# Patient Record
Sex: Female | Born: 1975 | Race: White | Hispanic: No | Marital: Married | Smoking: Never smoker
Health system: Southern US, Community
[De-identification: ages and names within clinical notes are randomized; demographics above are authoritative.]

---

## 2006-07-17 ENCOUNTER — Inpatient Hospital Stay: Payer: Self-pay

## 2007-04-26 ENCOUNTER — Emergency Department: Payer: Self-pay | Admitting: Emergency Medicine

## 2007-09-23 ENCOUNTER — Ambulatory Visit: Payer: Self-pay | Admitting: Obstetrics and Gynecology

## 2008-10-12 ENCOUNTER — Ambulatory Visit: Payer: Self-pay

## 2009-10-19 ENCOUNTER — Ambulatory Visit: Payer: Self-pay

## 2010-10-24 ENCOUNTER — Ambulatory Visit: Payer: Self-pay

## 2011-10-29 ENCOUNTER — Ambulatory Visit: Payer: Self-pay

## 2012-12-04 ENCOUNTER — Ambulatory Visit: Payer: Self-pay

## 2013-11-24 ENCOUNTER — Ambulatory Visit: Payer: Self-pay

## 2014-01-19 DIAGNOSIS — J4520 Mild intermittent asthma, uncomplicated: Secondary | ICD-10-CM | POA: Insufficient documentation

## 2014-01-19 DIAGNOSIS — E669 Obesity, unspecified: Secondary | ICD-10-CM | POA: Insufficient documentation

## 2014-11-30 ENCOUNTER — Ambulatory Visit
Admit: 2014-11-30 | Disposition: A | Discharge status: Home / Self Care | Payer: Self-pay | Attending: Obstetrics and Gynecology | Admitting: Obstetrics and Gynecology

## 2015-12-18 ENCOUNTER — Ambulatory Visit
Admission: RE | Admit: 2015-12-18 | Discharge: 2015-12-18 | Disposition: A | Discharge status: Home / Self Care | Payer: BLUE CROSS/BLUE SHIELD | Source: Ambulatory Visit | Attending: Medical | Admitting: Medical

## 2015-12-18 ENCOUNTER — Other Ambulatory Visit: Payer: Self-pay | Admitting: Medical

## 2015-12-18 DIAGNOSIS — S99929D Unspecified injury of unspecified foot, subsequent encounter: Secondary | ICD-10-CM

## 2015-12-18 DIAGNOSIS — S99922D Unspecified injury of left foot, subsequent encounter: Secondary | ICD-10-CM | POA: Insufficient documentation

## 2015-12-26 ENCOUNTER — Ambulatory Visit (INDEPENDENT_AMBULATORY_CARE_PROVIDER_SITE_OTHER): Payer: BLUE CROSS/BLUE SHIELD | Admitting: Podiatry

## 2015-12-26 ENCOUNTER — Ambulatory Visit: Payer: BLUE CROSS/BLUE SHIELD

## 2015-12-26 VITALS — BP 141/85 | HR 64 | Resp 18

## 2015-12-26 DIAGNOSIS — S90129A Contusion of unspecified lesser toe(s) without damage to nail, initial encounter: Secondary | ICD-10-CM | POA: Diagnosis not present

## 2015-12-26 DIAGNOSIS — R52 Pain, unspecified: Secondary | ICD-10-CM | POA: Diagnosis not present

## 2015-12-26 NOTE — Progress Notes (Signed)
   Subjective:    Patient ID: Angela Miranda, female    DOB: 02/25/1976, 40 y.o.   MRN: [id number]  HPI stumped my left big toe on a door frame and my toes separated and does not hurt a lot and x-rays were done on may 1st     Review of Systems  All other systems reviewed and are negative.      Objective:   Physical Exam        Assessment & Plan:

## 2015-12-26 NOTE — Progress Notes (Signed)
Patient ID: Angela Miranda, female   DOB: Mar 15, 1976, 40 y.o.   MRN: [id number]  40 year old  female presents the office today for concerns of her right second and third toes separating. She states about 1.5 months ago she walked into a doorframe jamming her second third toes causing them toseparate. She's a that she does not have any pain she can run 3 miles a any discomfort however when she wears certain shoes that she has noticed that her toe is turned some. Denies any recent swelling or redness. No previous treatment other than tried tape her toes which was uncomfortable to have something around her toes. No other complaints.   Review of Systems  All other systems reviewed and are negative.      Objective:   Physical Exam General: AAO x3, NAD  Dermatological: Skin is warm, dry and supple bilateral. Nails x 10 are well manicured; remaining integument appears unremarkable at this time. There are no open sores, no preulcerative lesions, no rash or signs of infection present.  Vascular: Dorsalis Pedis artery and Posterior Tibial artery pedal pulses are 2/4 bilateral with immedate capillary fill time. Pedal hair growth present. No varicosities and no lower extremity edema present bilateral. There is no pain with calf compression, swelling, warmth, erythema.   Neruologic: Grossly intact via light touch bilateral. Vibratory intact via tuning fork bilateral. Protective threshold with Semmes Wienstein monofilament intact to all pedal sites bilateral. Patellar and Achilles deep tendon reflexes 2+ bilateral. No Babinski or clonus noted bilateral.   Musculoskeletal: There is no tenderness to palpation on the digits this time. There is slight deviation of the second third toes with [place] in between them on the right foot. There is no tenderness on the MPJ or over the range of motion. No pain along the interspace. There is no edema, erythema. MMT 5/5.  Gait: Unassisted, Nonantalgic.      Assessment:   40 year old female right second and third toe sprain causing slight deviation     Plan:     -Treatment options discussed including all alternatives, risks, and complications - X-rays that she had obtained previously reviewed. No evidence of acute fracture. -Dispensed toe splints to help hold the second toes straight. Continue supportive shoe gear. As is no pain at this time continue regular shoe gear. Discussed with the future her toes abate continue separate may need surgery down the line however for now continue with conservative treatment. -Follow-up as needed. Call the office if any symptoms are to worsen.  Celesta Gentile, DPM

## 2016-06-10 ENCOUNTER — Other Ambulatory Visit: Payer: Self-pay | Admitting: Obstetrics and Gynecology

## 2016-06-10 DIAGNOSIS — Z1231 Encounter for screening mammogram for malignant neoplasm of breast: Secondary | ICD-10-CM

## 2016-06-20 ENCOUNTER — Ambulatory Visit
Admission: RE | Admit: 2016-06-20 | Discharge: 2016-06-20 | Disposition: A | Discharge status: Home / Self Care | Payer: BLUE CROSS/BLUE SHIELD | Source: Ambulatory Visit | Attending: Obstetrics and Gynecology | Admitting: Obstetrics and Gynecology

## 2016-06-20 DIAGNOSIS — Z1231 Encounter for screening mammogram for malignant neoplasm of breast: Secondary | ICD-10-CM | POA: Diagnosis present

## 2017-05-26 ENCOUNTER — Other Ambulatory Visit: Payer: Self-pay | Admitting: Obstetrics and Gynecology

## 2017-05-26 DIAGNOSIS — Z1231 Encounter for screening mammogram for malignant neoplasm of breast: Secondary | ICD-10-CM

## 2017-06-23 ENCOUNTER — Ambulatory Visit
Admission: RE | Admit: 2017-06-23 | Discharge: 2017-06-23 | Disposition: A | Discharge status: Home / Self Care | Payer: BLUE CROSS/BLUE SHIELD | Source: Ambulatory Visit | Attending: Obstetrics and Gynecology | Admitting: Obstetrics and Gynecology

## 2017-06-23 DIAGNOSIS — Z1231 Encounter for screening mammogram for malignant neoplasm of breast: Secondary | ICD-10-CM | POA: Diagnosis not present

## 2017-12-17 ENCOUNTER — Other Ambulatory Visit: Payer: BLUE CROSS/BLUE SHIELD

## 2017-12-17 DIAGNOSIS — Z0000 Encounter for general adult medical examination without abnormal findings: Secondary | ICD-10-CM

## 2017-12-18 LAB — CBC WITH DIFFERENTIAL/PLATELET
BASOS: 1 %
Basophils Absolute: 0 10*3/uL (ref 0.0–0.2)
EOS (ABSOLUTE): 0.4 10*3/uL (ref 0.0–0.4)
EOS: 7 %
HEMATOCRIT: 40.3 % (ref 34.0–46.6)
Hemoglobin: 13 g/dL (ref 11.1–15.9)
Immature Grans (Abs): 0 10*3/uL (ref 0.0–0.1)
Immature Granulocytes: 0 %
LYMPHS ABS: 2.3 10*3/uL (ref 0.7–3.1)
Lymphs: 37 %
MCH: 30 pg (ref 26.6–33.0)
MCHC: 32.3 g/dL (ref 31.5–35.7)
MCV: 93 fL (ref 79–97)
Monocytes Absolute: 0.4 10*3/uL (ref 0.1–0.9)
Monocytes: 7 %
Neutrophils Absolute: 3.1 10*3/uL (ref 1.4–7.0)
Neutrophils: 48 %
Platelets: 238 10*3/uL (ref 150–379)
RBC: 4.33 x10E6/uL (ref 3.77–5.28)
RDW: 13.6 % (ref 12.3–15.4)
WBC: 6.3 10*3/uL (ref 3.4–10.8)

## 2017-12-18 LAB — COMPREHENSIVE METABOLIC PANEL
ALK PHOS: 47 IU/L (ref 39–117)
ALT: 13 IU/L (ref 0–32)
AST: 14 IU/L (ref 0–40)
Albumin/Globulin Ratio: 1.5 (ref 1.2–2.2)
Albumin: 3.9 g/dL (ref 3.5–5.5)
BUN/Creatinine Ratio: 12 (ref 9–23)
BUN: 11 mg/dL (ref 6–24)
Bilirubin Total: 0.6 mg/dL (ref 0.0–1.2)
CO2: 21 mmol/L (ref 20–29)
CREATININE: 0.92 mg/dL (ref 0.57–1.00)
Calcium: 8.9 mg/dL (ref 8.7–10.2)
Chloride: 106 mmol/L (ref 96–106)
GFR calc Af Amer: 88 mL/min/{1.73_m2} (ref >59)
GFR calc non Af Amer: 76 mL/min/{1.73_m2} (ref >59)
GLOBULIN, TOTAL: 2.6 g/dL (ref 1.5–4.5)
Glucose: 87 mg/dL (ref 65–99)
Potassium: 4.3 mmol/L (ref 3.5–5.2)
Sodium: 142 mmol/L (ref 134–144)
Total Protein: 6.5 g/dL (ref 6.0–8.5)

## 2017-12-18 LAB — LIPID PANEL
CHOLESTEROL TOTAL: 169 mg/dL (ref 100–199)
Chol/HDL Ratio: 2.9 ratio (ref 0.0–4.4)
HDL: 59 mg/dL (ref >39)
LDL Calculated: 85 mg/dL (ref 0–99)
Triglycerides: 124 mg/dL (ref 0–149)
VLDL CHOLESTEROL CAL: 25 mg/dL (ref 5–40)

## 2017-12-18 LAB — TSH: TSH: 2.93 u[IU]/mL (ref 0.450–4.500)

## 2018-06-25 ENCOUNTER — Other Ambulatory Visit: Payer: Self-pay | Admitting: Obstetrics and Gynecology

## 2018-06-25 DIAGNOSIS — Z1231 Encounter for screening mammogram for malignant neoplasm of breast: Secondary | ICD-10-CM

## 2018-07-07 ENCOUNTER — Ambulatory Visit: Payer: BLUE CROSS/BLUE SHIELD | Admitting: Medical

## 2018-07-07 VITALS — BP 140/97 | HR 65 | Temp 99.6°F | Resp 16 | Wt 208.0 lb

## 2018-07-07 DIAGNOSIS — T7840XA Allergy, unspecified, initial encounter: Secondary | ICD-10-CM

## 2018-07-07 NOTE — Progress Notes (Signed)
   Subjective:    Patient ID: Angela Miranda, female    DOB: 07/19/1976, 42 y.o.   MRN: [id number]  HPI 42 yo female in non acute distress. Complains of both eyes "slight" burning x 1 week.  No itching. Wears contacts, changed them and they seemed better for  2 days but then the burning returned. Today is wearing glasses. No visual changes. Change in contact brand x 4 weeks. Has changed facial lotion x 3 weeks. Taking Claritin "when they bother me". Denies discharge.  History of someone at work with conjunctivitis and she wanted them checked.. Recently put up tree, which she thinks may have irritated her eyes.  Blood pressure (!) 140/97, pulse 65, temperature 99.6 F (37.6 C), temperature source Tympanic, resp. rate 16, weight 208 lb (94.3 kg), SpO2 100 %.  No Known Allergies   Review of Systems  Constitutional: Negative for chills and fever.  HENT: Negative for congestion, ear pain and sore throat.   Eyes: Negative for photophobia, pain, discharge, redness, itching and visual disturbance.  Respiratory: Negative for cough and shortness of breath.   Endocrine: Negative for polydipsia, polyphagia and polyuria.  Genitourinary: Negative for dysuria.  Musculoskeletal: Negative for myalgias.  Skin: Negative for rash.  Allergic/Immunologic: Positive for environmental allergies.  Neurological: Negative for dizziness, syncope and light-headedness.  Hematological: Negative for adenopathy.  Psychiatric/Behavioral: Negative for behavioral problems, confusion, self-injury and suicidal ideas.       Objective:   Physical Exam  Constitutional: She is oriented to person, place, and time. She appears well-nourished.  HENT:  Head: Normocephalic and atraumatic.  Eyes: Pupils are equal, round, and reactive to light. Conjunctivae, EOM and lids are normal. Right eye exhibits no chemosis, no discharge, no exudate and no hordeolum. No foreign body present in the right eye. Left eye exhibits no chemosis, no  discharge, no exudate and no hordeolum. No foreign body present in the left eye. No scleral icterus.  Neck: Normal range of motion. Neck supple.  Lymphadenopathy:    She has no cervical adenopathy.  Neurological: She is alert and oriented to person, place, and time.  Skin: Skin is warm and dry.  Psychiatric: She has a normal mood and affect. Her behavior is normal. Judgment and thought content normal.    Mildly injected, no discharge.      Assessment & Plan:  Allergy related burning of eyes. No contacts x one week. Saline eye drops 3-4 times a day.. OTC Zyrtec daily.  Follow up with your eye doctor in one weeks for recheck if not improved.  Patient to call if discharge, itching or increased redness of sclera starts and I will call in an eye drop antibiotic. Patient verbalizes understanding and has no questions at discharge.

## 2018-07-22 ENCOUNTER — Ambulatory Visit
Admission: RE | Admit: 2018-07-22 | Discharge: 2018-07-22 | Disposition: A | Discharge status: Home / Self Care | Payer: BLUE CROSS/BLUE SHIELD | Source: Ambulatory Visit | Attending: Obstetrics and Gynecology | Admitting: Obstetrics and Gynecology

## 2018-07-22 DIAGNOSIS — Z1231 Encounter for screening mammogram for malignant neoplasm of breast: Secondary | ICD-10-CM | POA: Diagnosis not present

## 2018-09-17 ENCOUNTER — Encounter: Payer: Self-pay | Admitting: Medical

## 2018-09-17 ENCOUNTER — Ambulatory Visit: Payer: BLUE CROSS/BLUE SHIELD | Admitting: Medical

## 2018-09-17 ENCOUNTER — Encounter: Payer: Self-pay | Admitting: *Deleted

## 2018-09-17 VITALS — BP 155/82 | HR 88 | Temp 98.1°F | Resp 16 | Ht 66.0 in | Wt 202.0 lb

## 2018-09-17 DIAGNOSIS — R1013 Epigastric pain: Secondary | ICD-10-CM

## 2018-09-17 DIAGNOSIS — R140 Abdominal distension (gaseous): Secondary | ICD-10-CM

## 2018-09-17 NOTE — Progress Notes (Signed)
Subjective:    Patient ID: Angela Miranda, female    DOB: [birth year], 43 y.o.   MRN: [id number]  HPI 43 yo female in non acute distress.  Started with gas and bloating, and gurgling  about 1 month ago lasting a week. 2 weeks ago with bloating and gas lasting  A few days. Eating yogurt and  Probiotics.  Then on Tuesday ate pistachio sweet chili ( a type of nut) burning epigastric area, went home at lunchtime and  Took an  antacid medication  Then burning resolved.  Bloating and gas returned on Tuesday mild per patient.  Today feels fine except for the feeling of needing ot burp. Patient concerned about possible ulcer.  Denies alcohol use,  Non smoker. Drinks coffee, but has stopped since all this started, drinks chai tea or green tea..  History of  Diarrhea with stress.  Avoid soft drinks due to diarrhea occurring immediately afterwards.  No symptoms today.  Review of Systems  Constitutional: Negative for chills, fatigue and fever.  HENT: Negative for congestion, ear pain and sore throat.   Eyes: Negative for discharge and itching.  Respiratory: Negative for cough, shortness of breath and wheezing.   Gastrointestinal: Positive for abdominal distention and abdominal pain (epigastric). Negative for anal bleeding, blood in stool, constipation, diarrhea, nausea, rectal pain and vomiting.  Endocrine: Negative for polydipsia, polyphagia and polyuria.  Genitourinary: Negative for difficulty urinating and dysuria.  Musculoskeletal: Negative for arthralgias and myalgias.  Skin: Negative for rash.  Allergic/Immunologic: Positive for environmental allergies. Negative for food allergies.  Neurological: Negative for dizziness, syncope, light-headedness and headaches.  Hematological: Negative for adenopathy.  Psychiatric/Behavioral: Negative for behavioral problems, confusion, self-injury and suicidal ideas.   No family with UC or Crohns or stomach cancer.    Objective:   Physical  Exam Vitals signs and nursing note reviewed.  Constitutional:      Appearance: Normal appearance.  HENT:     Head: Normocephalic and atraumatic.  Eyes:     Extraocular Movements: Extraocular movements intact.     Conjunctiva/sclera: Conjunctivae normal.     Pupils: Pupils are equal, round, and reactive to light.  Neck:     Musculoskeletal: Normal range of motion.  Cardiovascular:     Rate and Rhythm: Normal rate and regular rhythm.  Pulmonary:     Effort: Pulmonary effort is normal.     Breath sounds: Normal breath sounds.  Abdominal:     General: Abdomen is flat. Bowel sounds are normal.     Palpations: Abdomen is soft.     Tenderness: There is abdominal tenderness (epigastric area).  Skin:    General: Skin is warm and dry.  Neurological:     General: No focal deficit present.     Mental Status: She is alert and oriented to person, place, and time.  Psychiatric:        Mood and Affect: Mood normal.        Behavior: Behavior normal.        Thought Content: Thought content normal.        Judgment: Judgment normal.    Easily gets off the exam table , gait steady       Assessment & Plan:  Epigastic pain , Possiby GERD, No symptoms now.  Abdominal Bloating and gas Recommended to take OTC Prilosec 20 mg daily. Given bland food diet. To follow up with GI. If severe abdominal  pain to  Follow up with her PCP or an Urgent Care  or the Emergency Department. Patent verbalizes understanding and has no questions at discharge.

## 2018-09-17 NOTE — Patient Instructions (Signed)
Heartburn Heartburn is a type of pain or discomfort that can happen in the throat or chest. It is often described as a burning pain. It may also cause a bad, acid-like taste in the mouth. Heartburn may feel worse when you lie down or bend over. It may be worse at night. It may be caused by stomach contents that move back up (reflux) into the tube that connects the mouth with the stomach (esophagus). Follow these instructions at home: Eating and drinking   Avoid certain foods and drinks as told by your doctor. This may include: ? Coffee and tea (with or without caffeine). ? Drinks that have alcohol. ? Energy drinks and sports drinks. ? Carbonated drinks or sodas. ? Chocolate and cocoa. ? Peppermint and mint flavorings. ? Garlic and onions. ? Horseradish. ? Spicy and acidic foods, such as:  Peppers.  Chili powder and curry powder.  Vinegar.  Hot sauces and BBQ sauce. ? Citrus fruit juices and citrus fruits, such as:  Oranges.  Lemons.  Limes. ? Tomato-based foods, such as:  Red sauce and pizza with red sauce.  Chili.  Salsa. ? Fried and fatty foods, such as:  Donuts.  French fries and potato chips.  High-fat dressings. ? High-fat meats, such as:  Hot dogs and sausage.  Rib eye steak.  Ham and bacon. ? High-fat dairy items, such as:  Whole milk.  Butter.  Cream cheese.  Eat small meals often. Avoid eating large meals.  Avoid drinking large amounts of liquid with your meals.  Avoid eating meals during the 2-3 hours before bedtime.  Avoid lying down right after you eat.  Do not exercise right after you eat. Lifestyle      If you are overweight, lose an amount of weight that is healthy for you. Ask your doctor about a safe weight loss goal.  Do not use any products that contain nicotine or tobacco, including cigarettes, e-cigarettes, and chewing tobacco. These can make your symptoms worse. If you need help quitting, ask your doctor.  Wear loose  clothes. Do not wear anything tight around your waist.  Raise (elevate) the head of your bed about 6 inches (15 cm) when you sleep.  Try to lower your stress. If you need help doing this, ask your doctor. General instructions  Pay attention to any changes in your symptoms.  Take over-the-counter and prescription medicines only as told by your doctor. ? Do not take aspirin, ibuprofen, or other NSAIDs unless your doctor says it is okay. ? Stop medicines only as told by your doctor.  Keep all follow-up visits as told by your doctor. This is important. Contact a doctor if:  You have new symptoms.  You lose weight and you do not know why it is happening.  You have trouble swallowing, or it hurts to swallow.  You have wheezing or a cough that keeps happening.  Your symptoms do not get better with treatment.  You have heartburn often for more than 2 weeks. Get help right away if:  You have pain in your arms, neck, jaw, teeth, or back.  You feel sweaty, dizzy, or light-headed.  You have chest pain or shortness of breath.  You throw up (vomit) and your throw up looks like blood or coffee grounds.  Your poop (stool) is bloody or black. These symptoms may represent a serious problem that is an emergency. Do not wait to see if the symptoms will go away. Get medical help right away. Call your local   emergency services (911 in the [place] Do not drive yourself to the hospital. Summary  Heartburn is a type of pain that can happen in the throat or chest. It can feel like a burning pain. It may also cause a bad, acid-like taste in the mouth.  You may need to avoid certain foods and drinks to help your symptoms. Ask your doctor what foods and drinks you should avoid.  Take over-the-counter and prescription medicines only as told by your doctor. Do not take aspirin, ibuprofen, or other NSAIDs unless your doctor told you to do so.  Contact your doctor if your symptoms do not get better or  they get worse. This information is not intended to replace advice given to you by your health care provider. Make sure you discuss any questions you have with your health care provider. Document Released: 04/17/2011 Document Revised: 01/05/2018 Document Reviewed: 01/05/2018 Elsevier Interactive Patient Education  2019 [place] Diet A bland diet consists of foods that are often soft and do not have a lot of fat, fiber, or extra seasonings. Foods without fat, fiber, or seasoning are easier for the body to digest. They are also less likely to irritate your mouth, throat, stomach, and other parts of your digestive system. A bland diet is sometimes called a BRAT diet. What is my plan? Your health care provider or food and nutrition specialist (dietitian) may recommend specific changes to your diet to prevent symptoms or to treat your symptoms. These changes may include:  Eating small meals often.  Cooking food until it is soft enough to chew easily.  Chewing your food well.  Drinking fluids slowly.  Not eating foods that are very spicy, sour, or fatty.  Not eating citrus fruits, such as oranges and grapefruit. What do I need to know about this diet?  Eat a variety of foods from the bland diet food list.  Do not follow a bland diet longer than needed.  Ask your health care provider whether you should take vitamins or supplements. What foods can I eat? Grains  Hot cereals, such as cream of wheat. Rice. Bread, crackers, or tortillas made from refined white flour. Vegetables Canned or cooked vegetables. Mashed or boiled potatoes. Fruits  Bananas. Applesauce. Other types of cooked or canned fruit with the skin and seeds removed, such as canned peaches or pears. Meats and other proteins  Scrambled eggs. Creamy peanut butter or other nut butters. Lean, well-cooked meats, such as chicken or fish. Tofu. Soups or broths. Dairy Low-fat dairy products, such as milk, cottage  cheese, or yogurt. Beverages  Water. Herbal tea. Apple juice. Fats and oils Mild salad dressings. Canola or olive oil. Sweets and desserts Pudding. Custard. Fruit gelatin. Ice cream. The items listed above may not be a complete list of recommended foods and beverages. Contact a dietitian for more options. What foods are not recommended? Grains Whole grain breads and cereals. Vegetables Raw vegetables. Fruits Raw fruits, especially citrus, berries, or dried fruits. Dairy Whole fat dairy foods. Beverages Caffeinated drinks. Alcohol. Seasonings and condiments Strongly flavored seasonings or condiments. Hot sauce. Salsa. Other foods Spicy foods. Fried foods. Sour foods, such as pickled or fermented foods. Foods with high sugar content. Foods high in fiber. The items listed above may not be a complete list of foods and beverages to avoid. Contact a dietitian for more information. Summary  A bland diet consists of foods that are often soft and do not have a lot of fat, fiber, or extra  seasonings.  Foods without fat, fiber, or seasoning are easier for the body to digest.  Check with your health care provider to see how long you should follow this diet plan. It is not meant to be followed for long periods. This information is not intended to replace advice given to you by your health care provider. Make sure you discuss any questions you have with your health care provider. Document Released: 11/27/2015 Document Revised: 09/03/2017 Document Reviewed: 09/03/2017 Elsevier Interactive Patient Education  2019 [employer].

## 2018-09-22 ENCOUNTER — Other Ambulatory Visit: Payer: Self-pay | Admitting: Internal Medicine

## 2018-09-22 DIAGNOSIS — R1013 Epigastric pain: Secondary | ICD-10-CM

## 2018-09-25 ENCOUNTER — Ambulatory Visit
Admission: RE | Admit: 2018-09-25 | Discharge: 2018-09-25 | Disposition: A | Discharge status: Home / Self Care | Payer: BLUE CROSS/BLUE SHIELD | Source: Ambulatory Visit | Attending: Internal Medicine | Admitting: Internal Medicine

## 2018-09-25 DIAGNOSIS — R1013 Epigastric pain: Secondary | ICD-10-CM | POA: Insufficient documentation

## 2018-10-13 ENCOUNTER — Encounter: Payer: Self-pay | Admitting: Medical

## 2018-10-13 ENCOUNTER — Ambulatory Visit: Payer: BLUE CROSS/BLUE SHIELD | Admitting: Medical

## 2018-10-13 VITALS — BP 149/87 | HR 88 | Temp 99.9°F | Resp 16 | Wt 194.8 lb

## 2018-10-13 DIAGNOSIS — B3781 Candidal esophagitis: Principal | ICD-10-CM

## 2018-10-13 DIAGNOSIS — B370 Candidal stomatitis: Secondary | ICD-10-CM

## 2018-10-13 NOTE — Progress Notes (Signed)
   Subjective:    Patient ID: DEIDREA GAETZ, female    DOB: [birth year], 43 y.o.   MRN: [id number]  HPI  43 yo female in non acute distress. Dr. Consepcion Hearing treated her for her H. Pylori. "Thrush in mouth began about  A week ago. Raw feeling of tongue and mild sore throat.  Called PA of Dr. Alice Reichert   Seen by counseling  For Anxiety. Blood pressure (!) 149/87, pulse 88, temperature 99.9 F (37.7 C), temperature source Tympanic, resp. rate 16, weight 194 lb 12.8 oz (88.4 kg), SpO2 99 %. Review of Systems  Constitutional: Negative for chills and fever.  HENT: Negative for congestion, ear discharge and sore throat.   Eyes: Negative for discharge and itching.  Respiratory: Negative for cough and shortness of breath.   Cardiovascular: Negative for chest pain.  Gastrointestinal: Negative for abdominal pain.  Genitourinary: Negative for dysuria.  Musculoskeletal: Negative for myalgias.  Skin: Negative for rash.  Neurological: Negative for dizziness, syncope and light-headedness.   Has appoitment for eyes with Dr. Lesli Albee tomorrow.    Objective:   Physical Exam NAD /AT no cervical adenopathy Normal pulmonary effort. MAEW Gait steady and wnl.  Mouth Back of tongue  White and patchy area typical of oral thrush    Assessment & Plan:  Thrush of the mouth Clortrimazole lozenge one by mouth  5 times a day x 10 days. Avoid hot spicy or acidic foods, this can irritate your mouth while you are healing. Follow up with your family doctor or return to clinic in 3- 5 days if not improving. Patient verbalizes understanding and has no questions at discharge.

## 2018-10-13 NOTE — Patient Instructions (Signed)
Oral Thrush, Adult  Oral thrush is an infection in your mouth and throat. It causes white patches on your tongue and in your mouth. Follow these instructions at home: Helping with soreness   To lessen your pain: ? Drink cold liquids, like water and iced tea. ? Eat frozen ice pops or frozen juices. ? Eat foods that are easy to swallow, like gelatin and ice cream. ? Drink from a straw if the patches in your mouth are painful. General instructions  Take or use over-the-counter and prescription medicines only as told by your doctor. Medicine for oral thrush may be something to swallow, or it may be something to put on the infected area.  Eat plain yogurt that has live cultures in it. Read the label to make sure.  If you wear dentures: ? Take out your dentures before you go to bed. ? Brush them well. ? Soak them in a denture cleaner.  Rinse your mouth with warm salt-water many times a day. To make the salt-water mixture, completely dissolve 1/2-1 teaspoon of salt in 1 cup of warm water. Contact a doctor if:  Your problems are getting worse.  Your problems do not get better in less than 7 days with treatment.  Your infection is spreading. This may show as white patches on the skin outside of your mouth.  You are nursing your baby and you have redness and pain in the nipples. This information is not intended to replace advice given to you by your health care provider. Make sure you discuss any questions you have with your health care provider. Document Released: 10/30/2009 Document Revised: 04/29/2016 Document Reviewed: 04/29/2016 Elsevier Interactive Patient Education  2019 [employer]  

## 2018-10-19 ENCOUNTER — Ambulatory Visit: Payer: BLUE CROSS/BLUE SHIELD | Admitting: Gastroenterology

## 2018-10-19 ENCOUNTER — Encounter

## 2018-10-21 IMAGING — MG DIGITAL SCREENING BILATERAL MAMMOGRAM WITH CAD
4 series · 4 of 4 positions shown · non-contrast
Comparison: Previous exam(s).

CLINICAL DATA: Screening.

EXAM:
DIGITAL SCREENING BILATERAL MAMMOGRAM WITH CAD

[L CC]
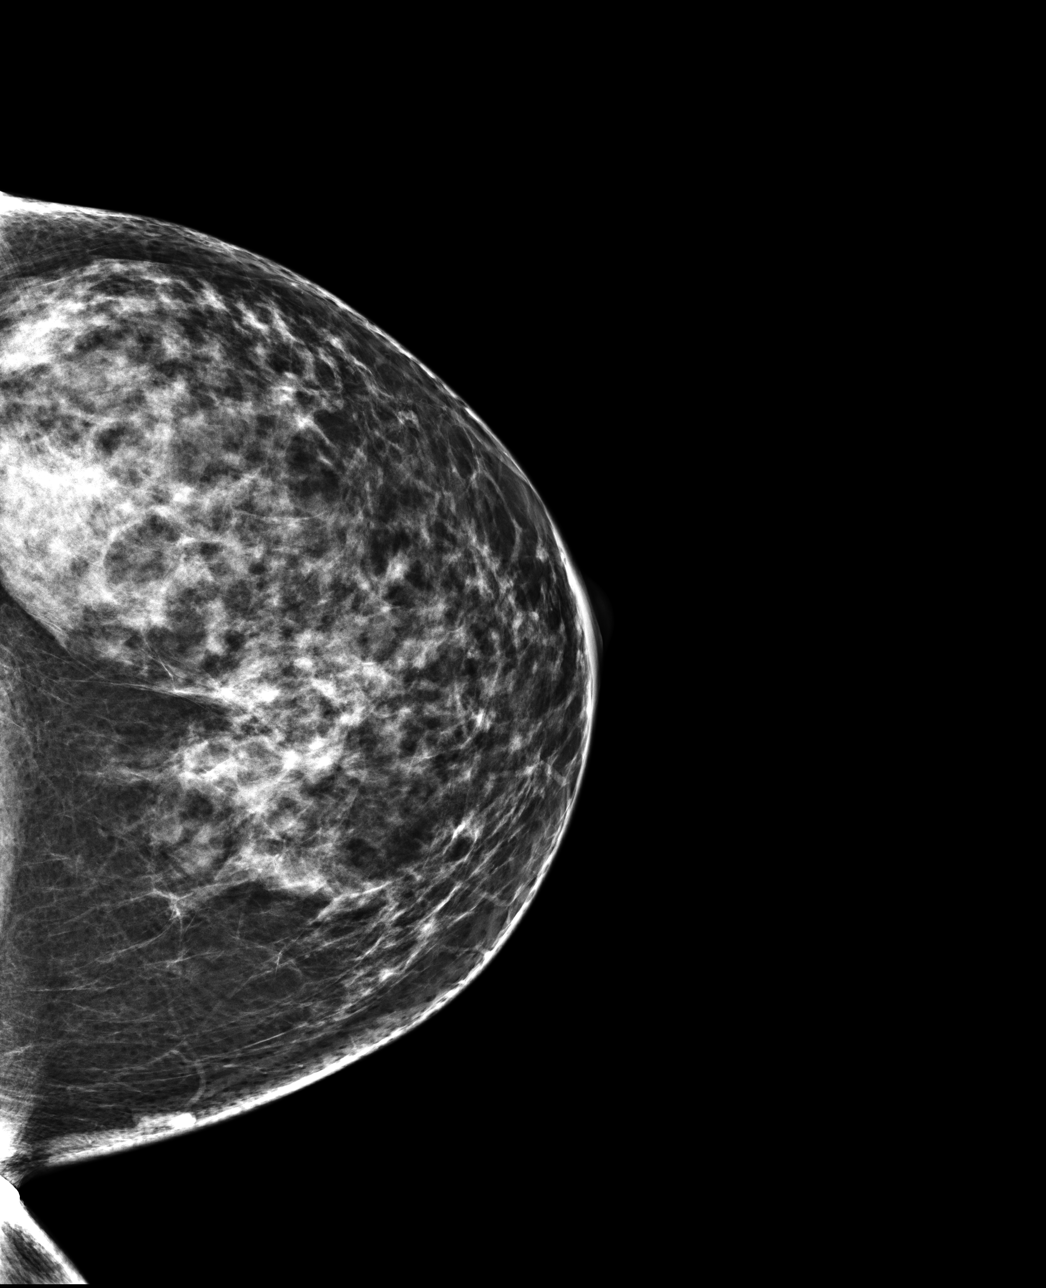

[L MLO]
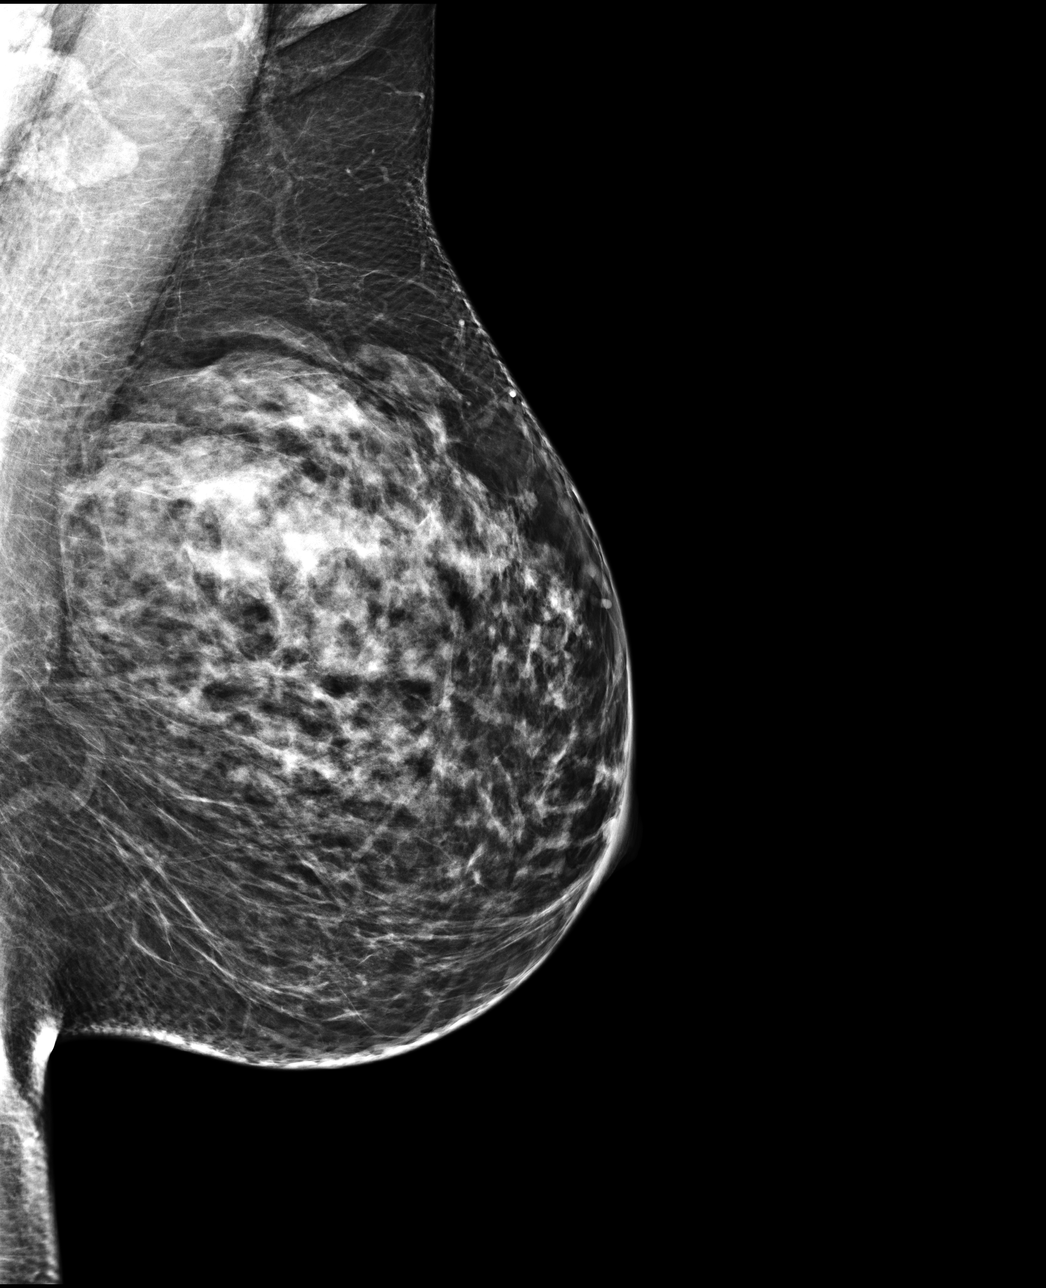

[R MLO]
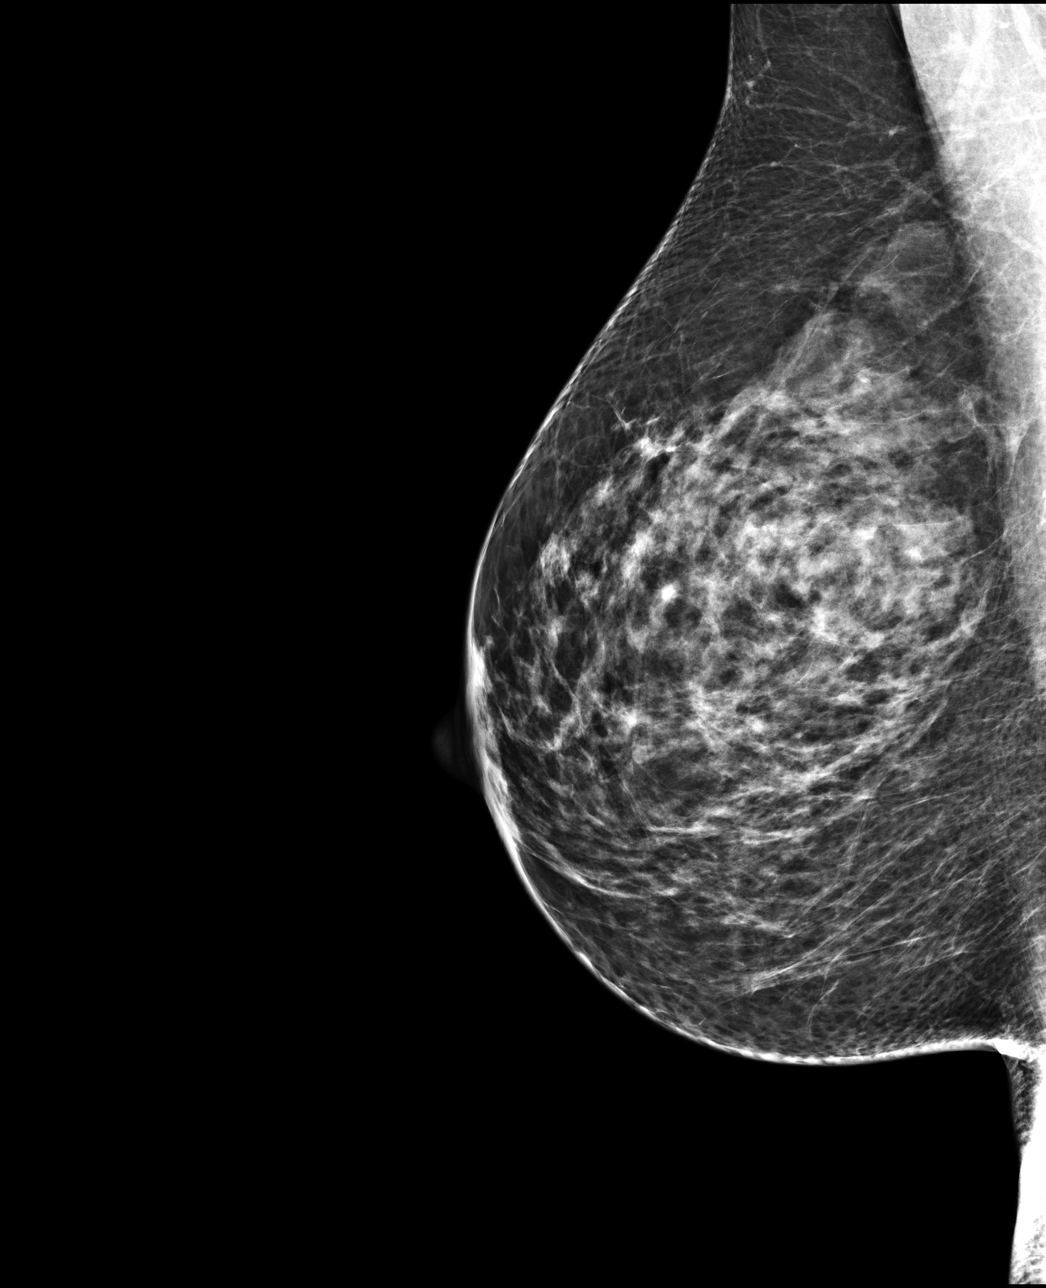

[R CC]
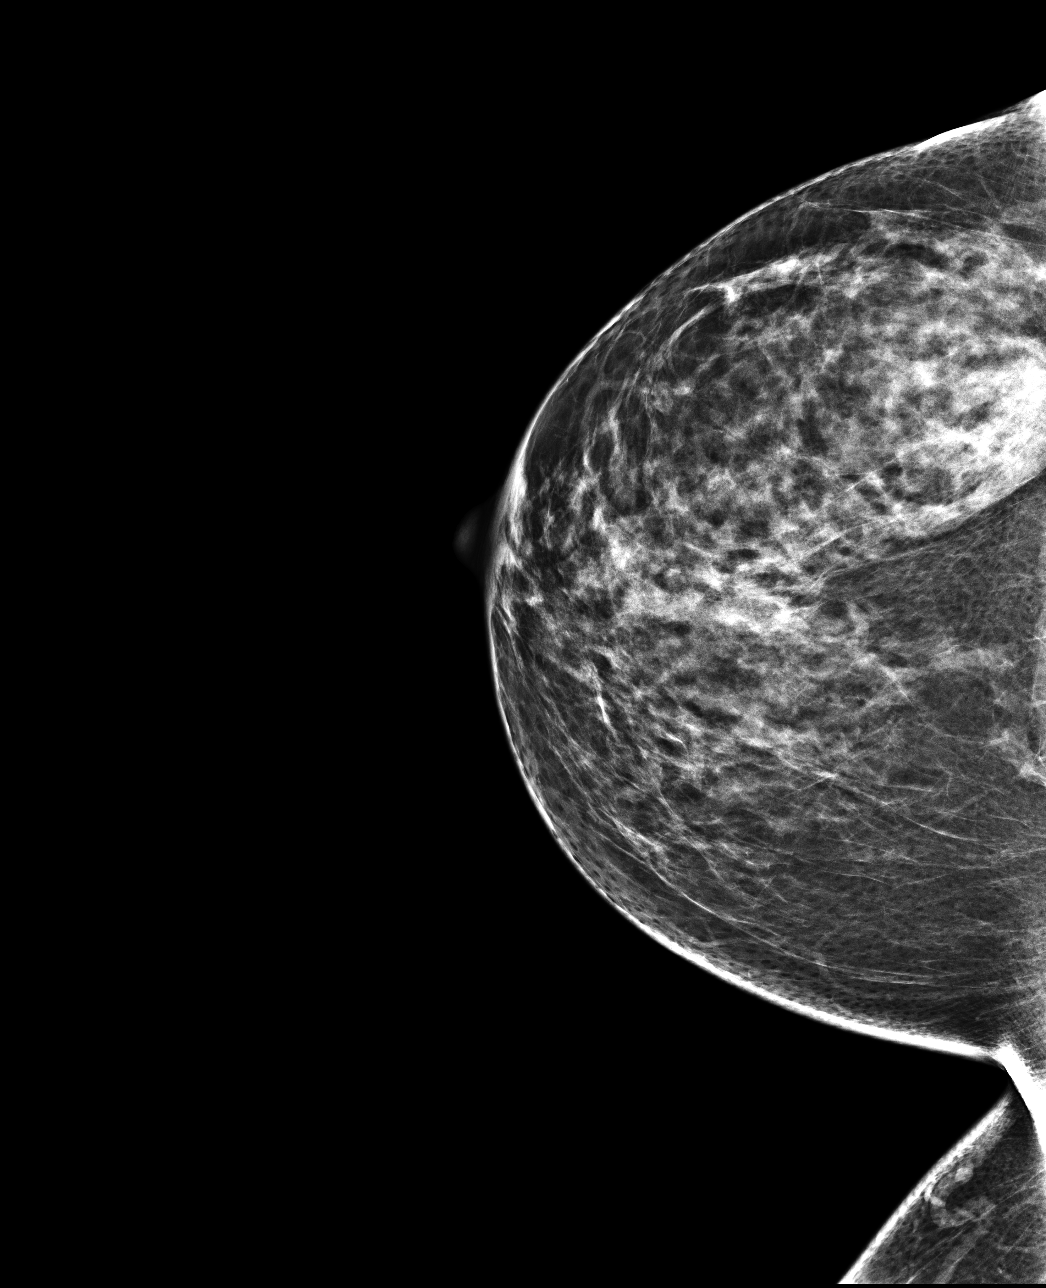

[4 of 4 positions shown; findings below may reference images not displayed]

ACR Breast Density Category c: The breast tissue is heterogeneously
dense, which may obscure small masses.
FINDINGS: There are no findings suspicious for malignancy. Images were
processed with CAD.
IMPRESSION: No mammographic evidence of malignancy. A result letter of this
screening mammogram will be mailed directly to the patient.

RECOMMENDATION:
Screening mammogram in one year. (Code:[id number])

BI-RADS CATEGORY  1: Negative.

## 2018-12-31 ENCOUNTER — Other Ambulatory Visit: Payer: Self-pay

## 2018-12-31 ENCOUNTER — Ambulatory Visit: Payer: BLUE CROSS/BLUE SHIELD | Admitting: Nurse Practitioner

## 2018-12-31 ENCOUNTER — Encounter: Payer: Self-pay | Admitting: Nurse Practitioner

## 2018-12-31 VITALS — BP 134/82 | HR 81 | Temp 98.4°F | Resp 16 | Ht 66.0 in | Wt 180.0 lb

## 2018-12-31 DIAGNOSIS — B370 Candidal stomatitis: Secondary | ICD-10-CM

## 2018-12-31 MED ORDER — CLOTRIMAZOLE 10 MG MT LOZG: 10.0000 mg | LOZENGE | Freq: Every day | OROMUCOSAL | 0 refills | Status: DC

## 2018-12-31 NOTE — Patient Instructions (Addendum)
Thrush - Plan: clotrimazole (MYCELEX) 10 MG troche   Peri your thrush was likely a result of your last dose of antibiotic therapy for h. Pylori. With the understanding this is your 2nd occurrence of oral thrush in the last several months in which you develop thrush, please keep this in mind. Try to boost your immune system up or consider a probiotic/live yogurt if you're not doing this already Encouraged patient to call the office or primary care doctor for an appointment if no improvement in symptoms or if symptoms change or worsen after 72 hours of planned treatment. Patient verbalized understanding of all instructions given/reviewed and has no further questions or concerns at this time.     Oral Thrush, Adult   Oral thrush is an infection in your mouth and throat. It causes white patches on your tongue and in your mouth. Follow these instructions at home: Helping with soreness    To lessen your pain: ? Drink cold liquids, like water and iced tea. ? Eat frozen ice pops or frozen juices. ? Eat foods that are easy to swallow, like gelatin and ice cream. ? Drink from a straw if the patches in your mouth are painful. General instructions  Take or use over-the-counter and prescription medicines only as told by your doctor. Medicine for oral thrush may be something to swallow, or it may be something to put on the infected area.  Eat plain yogurt that has live cultures in it. Read the label to make sure.  If you wear dentures: ? Take out your dentures before you go to bed. ? Brush them well. ? Soak them in a denture cleaner.  Rinse your mouth with warm salt-water many times a day. To make the salt-water mixture, completely dissolve 1/2-1 teaspoon of salt in 1 cup of warm water. Contact a doctor if:  Your problems are getting worse.  Your problems do not get better in less than 7 days with treatment.  Your infection is spreading. This may show as white patches on the skin  outside of your mouth.  You are nursing your baby and you have redness and pain in the nipples. This information is not intended to replace advice given to you by your health care provider. Make sure you discuss any questions you have with your health care provider. Document Released: 10/30/2009 Document Revised: 04/29/2016 Document Reviewed: 04/29/2016 Elsevier Interactive Patient Education  [employer].

## 2018-12-31 NOTE — Progress Notes (Signed)
   Subjective:    Patient ID: Angela Miranda, female    DOB: 1975/12/14, 43 y.o.   MRN: [id number]  HPI Colletta [place] is here today with c/o oral thrush which she was seen 09/2018 for this. Interim findings show an ongoing battle of h.pylori which seems to now be resolved, but she has been on a few different antibiotics for this in the last few months in which she stopped 5/4 and develop thrush concern 5/8. She hasn't tried any self treatment. She denies rash, fever, vaginal itching or discharge.   She has a hx of asthma and obesity.   Review of Systems  Constitutional: Negative for fever.  HENT:       Oral thrush  Respiratory: Negative for cough and shortness of breath.        Objective:   Physical Exam Constitutional:      Appearance: Normal appearance.  HENT:     Head: Normocephalic and atraumatic.     Mouth/Throat:     Mouth: Mucous membranes are moist.     Comments: Thick white patches on tongue. None on pharynx or erythema noted Neck:     Musculoskeletal: Normal range of motion and neck supple.  Skin:    General: Skin is warm and dry.  Neurological:     Mental Status: She is alert.           Assessment & Plan:

## 2019-01-12 ENCOUNTER — Other Ambulatory Visit: Payer: Self-pay | Admitting: Nurse Practitioner

## 2019-01-12 ENCOUNTER — Telehealth: Payer: Self-pay | Admitting: *Deleted

## 2019-01-12 DIAGNOSIS — B370 Candidal stomatitis: Secondary | ICD-10-CM

## 2019-01-12 MED ORDER — CLOTRIMAZOLE 10 MG MT LOZG: 10.0000 mg | LOZENGE | Freq: Every day | OROMUCOSAL | 0 refills | Status: DC

## 2019-01-12 NOTE — Telephone Encounter (Signed)
Called pt as requested by [facility] NP and advised a refill of Mycelex has been sent in to pharmacy and if she does not have complete resolution after this course to reach out to GI doctor as the thrush may extend beyond tongue and oral cavity. Pt verbalizes understanding and states she has already picked up refill. No further needs or questions as this time.

## 2019-01-12 NOTE — Telephone Encounter (Signed)
Pt called requesting refill on Mycelex 10mg  troche for thrush. Pt reports she completed 7d course last week but still has a small patch of thrush remaining and last time she was treated for thrush she was treated for 14d. Message forwarded to M.[place] NP.

## 2019-06-10 IMAGING — US ULTRASOUND ABDOMEN LIMITED
1 series · 14 of 25 positions shown · non-contrast
Comparison: None.

CLINICAL DATA: Epigastric region pain

EXAM:
ULTRASOUND ABDOMEN LIMITED RIGHT UPPER QUADRANT

[Series 1: ultrasound abdomen limited · 0.22mm/px · 14 of 45 slices shown]
[im 1/45]
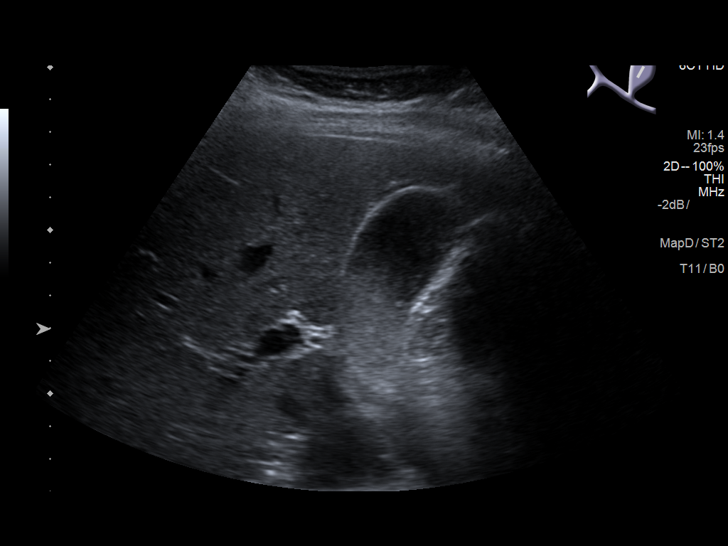
[im 4/45]
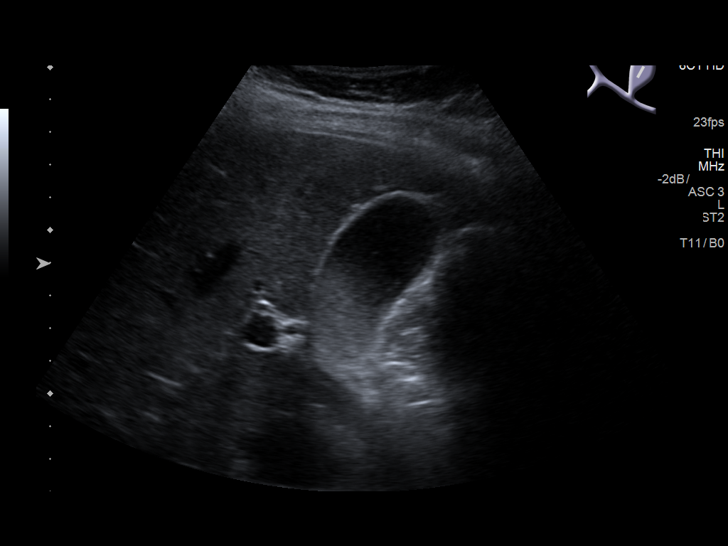
[im 8/45]
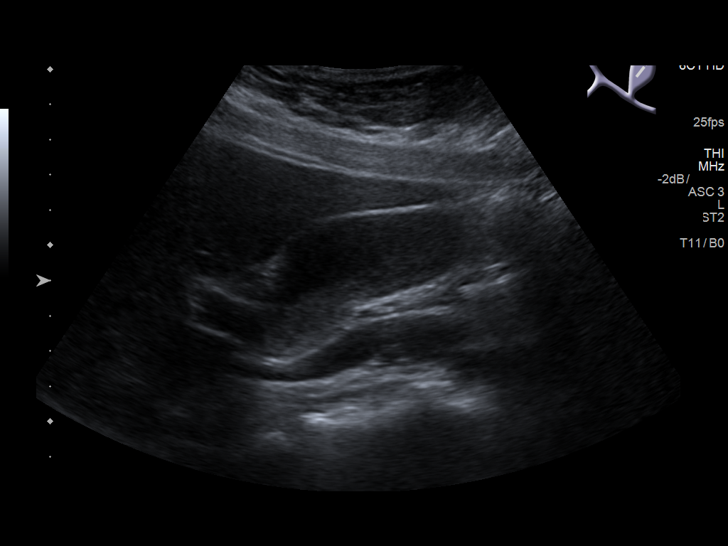
[im 12/45]
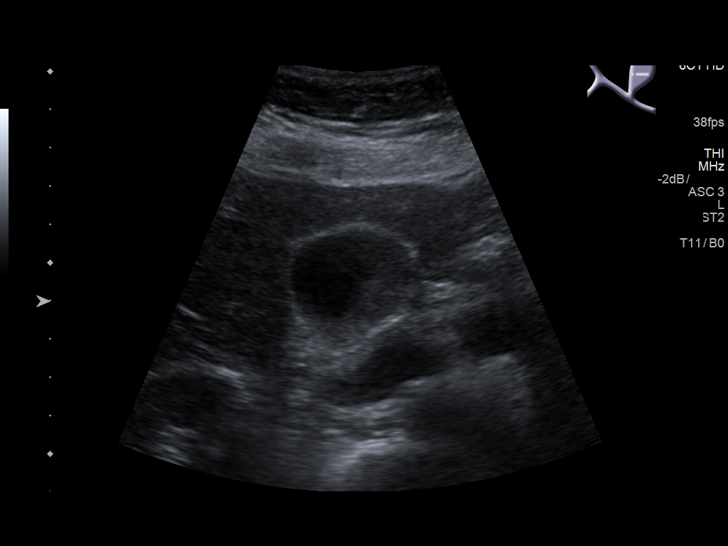
[im 15/45]
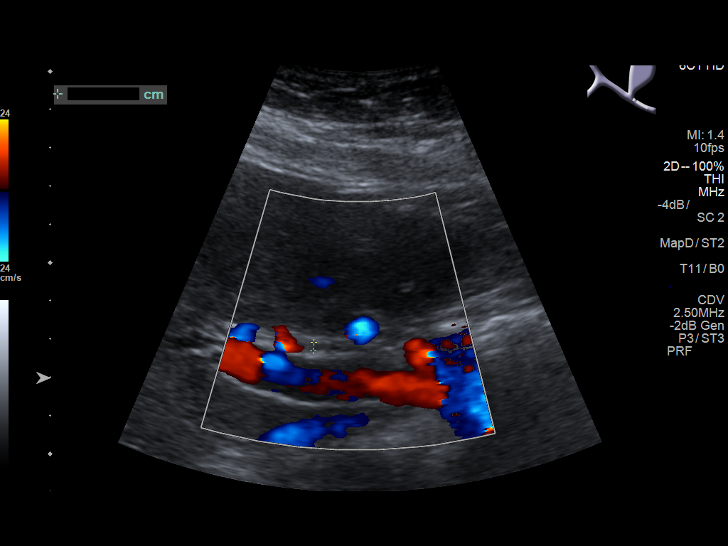
[im 17/45]
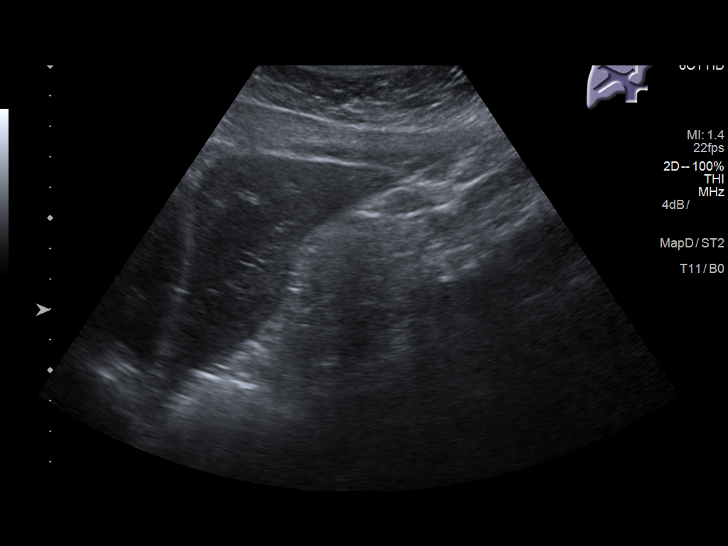
[im 21/45]
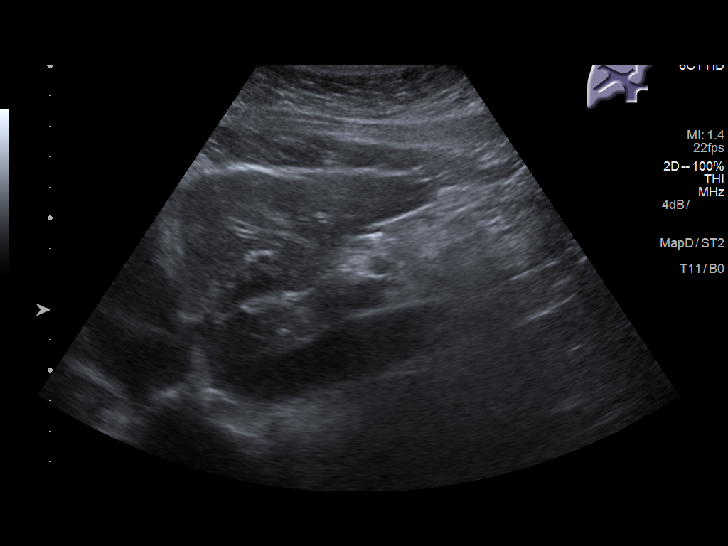
[im 24/45]
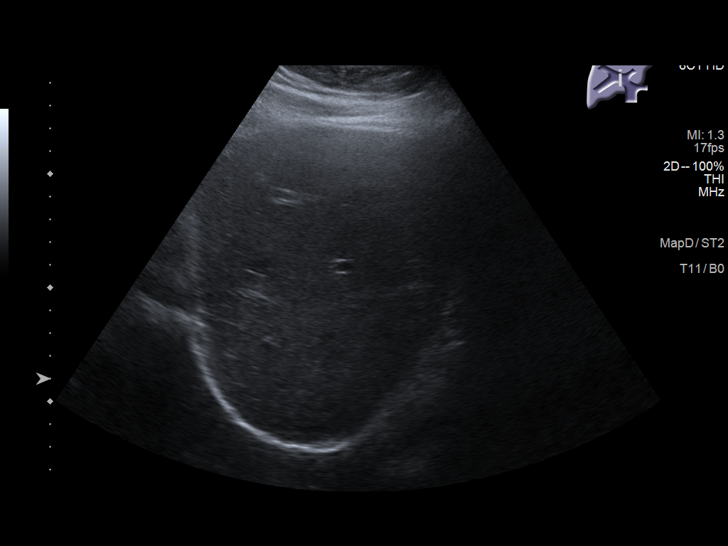
[im 28/45]
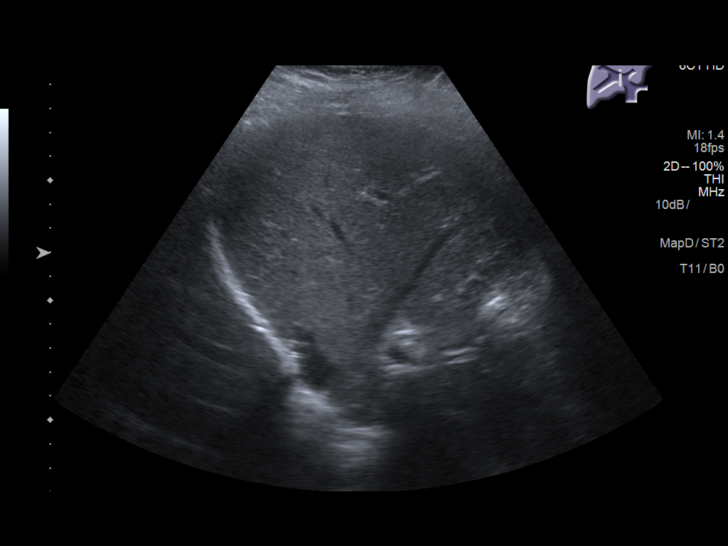
[im 30/45]
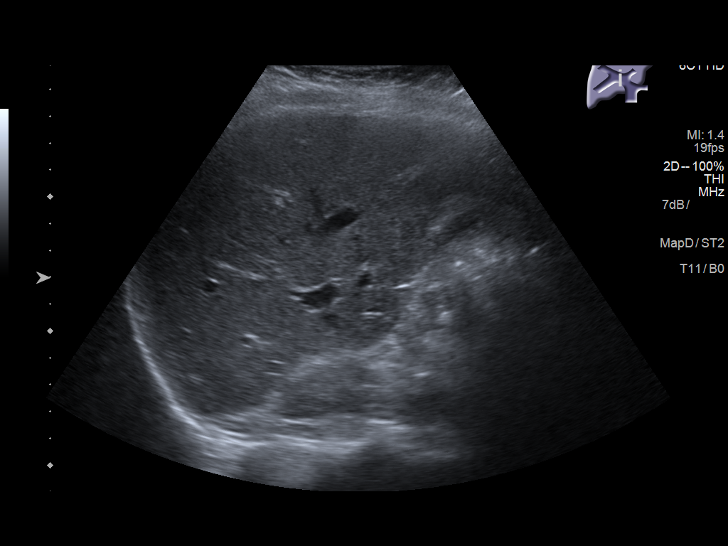
[im 34/45]
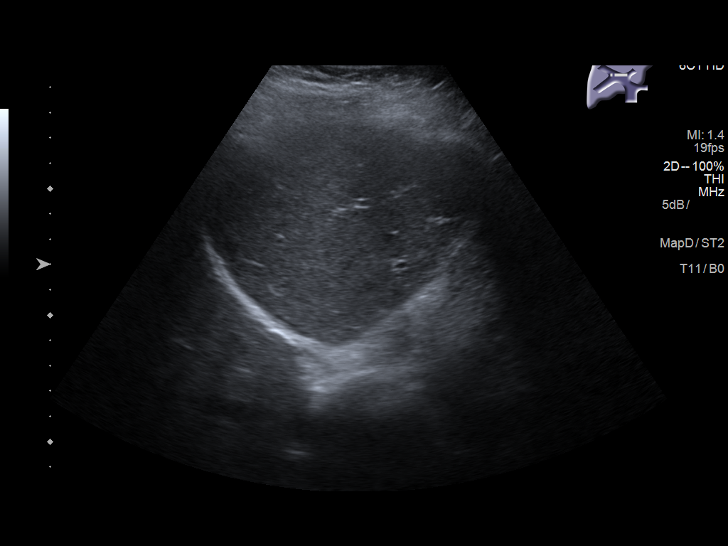
[im 37/45]
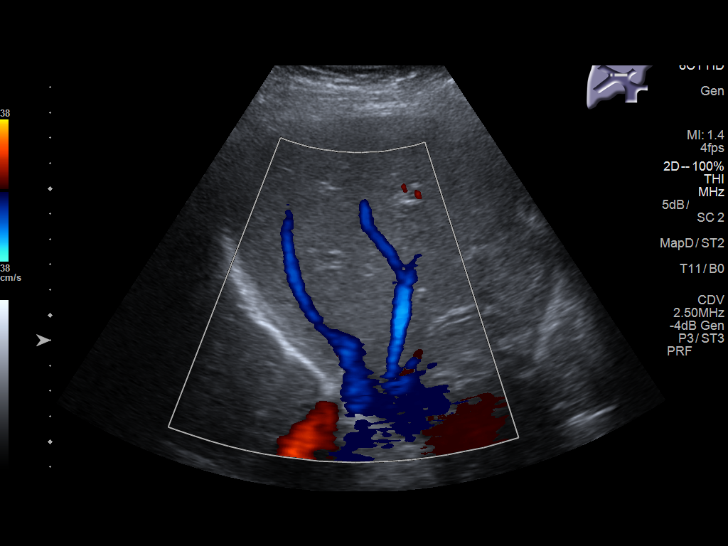
[im 41/45]
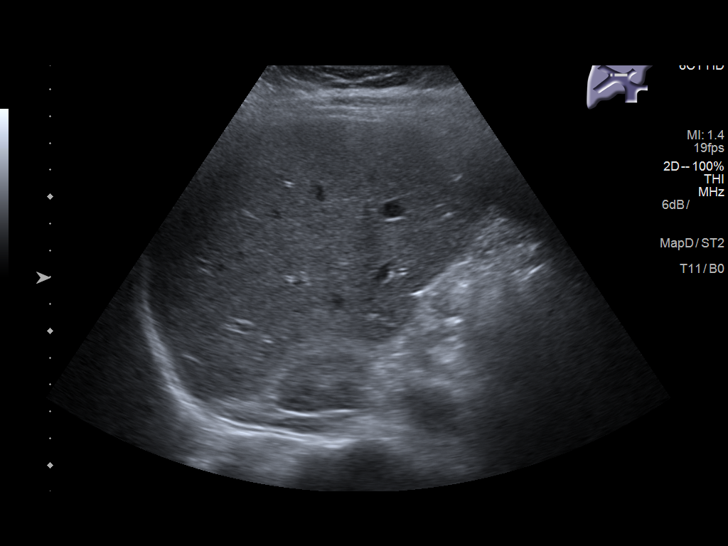
[im 45/45]
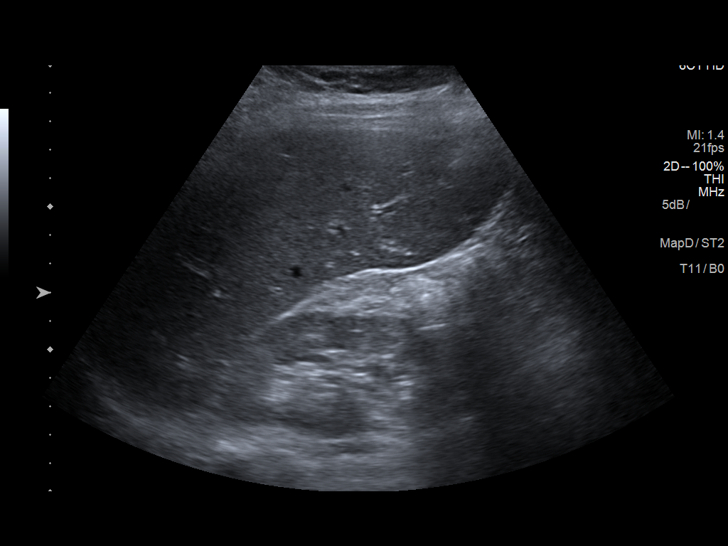

[14 of 25 positions shown; findings below may reference images not displayed]

FINDINGS: Gallbladder:

There is sludge throughout much of the gallbladder. No gallstones
are demonstrable. There is no appreciable gallbladder wall
thickening or pericholecystic fluid.. No sonographic Murphy sign
noted by sonographer.

Common bile duct:

Diameter: 2 mm. No intrahepatic or extrahepatic biliary duct
dilatation.

Liver:

No focal lesion identified. Within normal limits in parenchymal
echogenicity. Portal vein is patent on color Doppler imaging with
normal direction of blood flow towards the liver.
IMPRESSION: Sludge throughout gallbladder. No gallstones. No gallbladder wall
thickening or pericholecystic fluid. Study otherwise unremarkable.

## 2019-08-23 ENCOUNTER — Other Ambulatory Visit: Payer: Self-pay | Admitting: Obstetrics and Gynecology

## 2019-08-23 DIAGNOSIS — Z1231 Encounter for screening mammogram for malignant neoplasm of breast: Secondary | ICD-10-CM

## 2019-08-24 ENCOUNTER — Ambulatory Visit
Admission: RE | Admit: 2019-08-24 | Discharge: 2019-08-24 | Disposition: A | Discharge status: Home / Self Care | Payer: BC Managed Care – PPO | Source: Ambulatory Visit | Attending: Obstetrics and Gynecology | Admitting: Obstetrics and Gynecology

## 2019-08-24 DIAGNOSIS — Z1231 Encounter for screening mammogram for malignant neoplasm of breast: Secondary | ICD-10-CM | POA: Diagnosis present

## 2020-01-04 ENCOUNTER — Other Ambulatory Visit: Payer: Self-pay

## 2020-01-04 ENCOUNTER — Ambulatory Visit: Payer: BC Managed Care – PPO | Admitting: Dermatology

## 2020-01-04 DIAGNOSIS — L858 Other specified epidermal thickening: Secondary | ICD-10-CM

## 2020-01-04 DIAGNOSIS — D225 Melanocytic nevi of trunk: Secondary | ICD-10-CM

## 2020-01-04 DIAGNOSIS — Z1283 Encounter for screening for malignant neoplasm of skin: Secondary | ICD-10-CM | POA: Diagnosis not present

## 2020-01-04 DIAGNOSIS — L821 Other seborrheic keratosis: Secondary | ICD-10-CM

## 2020-01-04 DIAGNOSIS — L814 Other melanin hyperpigmentation: Secondary | ICD-10-CM

## 2020-01-04 DIAGNOSIS — L918 Other hypertrophic disorders of the skin: Secondary | ICD-10-CM

## 2020-01-04 DIAGNOSIS — L710 Perioral dermatitis: Secondary | ICD-10-CM

## 2020-01-04 DIAGNOSIS — D229 Melanocytic nevi, unspecified: Secondary | ICD-10-CM

## 2020-01-04 DIAGNOSIS — L578 Other skin changes due to chronic exposure to nonionizing radiation: Secondary | ICD-10-CM

## 2020-01-04 MED ORDER — CLINDAMYCIN PHOSPHATE 1 % EX LOTN: TOPICAL_LOTION | CUTANEOUS | 1 refills | Status: DC

## 2020-01-04 NOTE — Progress Notes (Signed)
   New Patient Visit  Subjective  Angela Miranda is a 44 y.o. female who presents for the following: TBSE. She has a spot on her left forehead at hairline that doesn't clear. She also has several moles to check, no changes noted.  Also bumps on arms.   The following portions of the chart were reviewed this encounter and updated as appropriate:      Review of Systems:  No other skin or systemic complaints except as noted in HPI or Assessment and Plan.  Objective  Well appearing patient in no apparent distress; mood and affect are within normal limits.  A full examination was performed including scalp, head, eyes, ears, nose, lips, neck, chest, axillae, abdomen, back, buttocks, bilateral upper extremities, bilateral lower extremities, hands, feet, fingers, toes, fingernails, and toenails. All findings within normal limits unless otherwise noted below.  Objective  Upper arms, lower legs: Tiny follicular keratotic papules.   Objective  Left Lateral Abdomen: 2.55mm med dark brown macule.  Patient states mole present for at least 5 years with no changes noticed.  Images    Objective  Perioral: Small pink papules R upper NL area and R perioral chin  Objective  left frontal hairline: 2.58mm waxy flesh papule   Assessment & Plan   Skin cancer screening performed today.  Actinic Damage - diffuse scaly erythematous macules with underlying dyspigmentation - Recommend daily broad spectrum sunscreen SPF 30+ to sun-exposed areas, reapply every 2 hours as needed.  - Call for new or changing lesions.  Melanocytic Nevi - Tan-brown and/or pink-flesh-colored symmetric macules and papules, including back - Benign appearing on exam today - Observation - Call clinic for new or changing moles - Recommend daily use of broad spectrum spf 30+ sunscreen to sun-exposed areas.   Lentigines - Scattered tan macules - Discussed due to sun exposure - Benign, observe - Call for any  changes  Seborrheic Keratoses - Stuck-on, waxy, tan-brown papules and plaques  - Discussed benign etiology and prognosis. - Observe - Call for any changes  Acrochordons (Skin Tags) - Fleshy, skin-colored pedunculated papules - Benign appearing.  - Observe. - If desired, they can be removed with an in office procedure that is not covered by insurance. - Please call the clinic if you notice any new or changing lesions.    Keratosis pilaris Upper arms, lower legs  AmLactin Daily and Ultra - samples given. Apply daily to affected areas.  Recommend mild soap.  Nevus Left Lateral Abdomen  Benign-appearing.  Observation.  Call clinic for new or changing moles.  Recommend daily use of broad spectrum spf 30+ sunscreen to sun-exposed areas.     Perioral dermatitis Perioral  Start Clindamycin lotion Apply to AA BID until improved.  If not improving, discussed sending in oral antibiotic (doxy).  clindamycin (CLEOCIN-T) 1 % lotion - Perioral  Seborrheic keratosis left frontal hairline  Benign, observe.  Discussed LN2 if becomes irritated.  Return in about 1 year (around 01/03/2021) for TBSE.   IJamesetta Orleans, CMA, am acting as scribe for Brendolyn Patty, MD .  Documentation: I have reviewed the above documentation for accuracy and completeness, and I agree with the above.  Brendolyn Patty MD

## 2020-08-01 ENCOUNTER — Other Ambulatory Visit: Payer: Self-pay | Admitting: Obstetrics and Gynecology

## 2020-08-01 DIAGNOSIS — Z1231 Encounter for screening mammogram for malignant neoplasm of breast: Secondary | ICD-10-CM

## 2020-08-24 ENCOUNTER — Other Ambulatory Visit: Payer: Self-pay

## 2020-08-24 ENCOUNTER — Ambulatory Visit
Admission: RE | Admit: 2020-08-24 | Discharge: 2020-08-24 | Disposition: A | Discharge status: Home / Self Care | Payer: BC Managed Care – PPO | Source: Ambulatory Visit | Attending: Obstetrics and Gynecology | Admitting: Obstetrics and Gynecology

## 2020-08-24 DIAGNOSIS — Z1231 Encounter for screening mammogram for malignant neoplasm of breast: Secondary | ICD-10-CM | POA: Insufficient documentation

## 2021-01-09 ENCOUNTER — Other Ambulatory Visit: Payer: Self-pay

## 2021-01-09 ENCOUNTER — Ambulatory Visit (INDEPENDENT_AMBULATORY_CARE_PROVIDER_SITE_OTHER): Payer: BC Managed Care – PPO | Admitting: Dermatology

## 2021-01-09 DIAGNOSIS — L578 Other skin changes due to chronic exposure to nonionizing radiation: Secondary | ICD-10-CM

## 2021-01-09 DIAGNOSIS — L820 Inflamed seborrheic keratosis: Secondary | ICD-10-CM

## 2021-01-09 DIAGNOSIS — D225 Melanocytic nevi of trunk: Secondary | ICD-10-CM

## 2021-01-09 DIAGNOSIS — L814 Other melanin hyperpigmentation: Secondary | ICD-10-CM

## 2021-01-09 DIAGNOSIS — D229 Melanocytic nevi, unspecified: Secondary | ICD-10-CM

## 2021-01-09 DIAGNOSIS — D485 Neoplasm of uncertain behavior of skin: Secondary | ICD-10-CM

## 2021-01-09 DIAGNOSIS — Z1283 Encounter for screening for malignant neoplasm of skin: Secondary | ICD-10-CM

## 2021-01-09 DIAGNOSIS — L821 Other seborrheic keratosis: Secondary | ICD-10-CM

## 2021-01-09 DIAGNOSIS — D1800 Hemangioma unspecified site: Secondary | ICD-10-CM

## 2021-01-09 DIAGNOSIS — L918 Other hypertrophic disorders of the skin: Secondary | ICD-10-CM

## 2021-01-09 NOTE — Patient Instructions (Addendum)

## 2021-01-09 NOTE — Progress Notes (Signed)
Follow-Up Visit   Subjective  Angela Miranda is a 45 y.o. female who presents for the following: Annual Exam (Patient here for TBSE. No history of skin cancer of abnormal moles. She has a pink bump on her right lower neck/shoulder area that is itchy and irritated.).   The following portions of the chart were reviewed this encounter and updated as appropriate:       Review of Systems:  No other skin or systemic complaints except as noted in HPI or Assessment and Plan.  Objective  Well appearing patient in no apparent distress; mood and affect are within normal limits.  A full examination was performed including scalp, head, eyes, ears, nose, lips, neck, chest, axillae, abdomen, back, buttocks, bilateral upper extremities, bilateral lower extremities, hands, feet, fingers, toes, fingernails, and toenails. All findings within normal limits unless otherwise noted below.  Objective  Left Lateral Abdomen: 2.40mm med dark brown macule.  Patient states mole present for at least 6 years with no changes noticed.    Left Spinal Mid Back: 4.40mm brown macule  Objective  R lower neck: 5.31mm pink scaly papule        Assessment & Plan   Skin cancer screening performed today.  Actinic Damage - chronic, secondary to cumulative UV radiation exposure/sun exposure over time - diffuse scaly erythematous macules with underlying dyspigmentation - Recommend daily broad spectrum sunscreen SPF 30+ to sun-exposed areas, reapply every 2 hours as needed.  - Recommend staying in the shade or wearing long sleeves, sun glasses (UVA+UVB protection) and wide brim hats (4-inch brim around the entire circumference of the hat). - Call for new or changing lesions.  Melanocytic Nevi - Tan-brown and/or pink-flesh-colored symmetric macules and papules - Benign appearing on exam today - Observation - Call clinic for new or changing moles - Recommend daily use of broad spectrum spf 30+ sunscreen to  sun-exposed areas.   Lentigines - Scattered tan macules - Due to sun exposure - Benign-appering, observe - Recommend daily broad spectrum sunscreen SPF 30+ to sun-exposed areas, reapply every 2 hours as needed. - Call for any changes  Hemangiomas - Red papules - Discussed benign nature - Observe - Call for any changes  Seborrheic Keratoses - Stuck-on, waxy, tan-brown papules and/or plaques, including left frontal hairline  - Benign-appearing - Discussed benign etiology and prognosis. - Observe - Call for any changes  Acrochordons (Skin Tags) - Fleshy, brown-colored pedunculated papules neck - Benign appearing.  - Observe. - If desired, they can be removed with an in office procedure that is not covered by insurance. - Please call the clinic if you notice any new or changing lesions.  Nevus (2) Left Lateral Abdomen; Left Spinal Mid Back  Benign-appearing.  Stable. Observation.  Call clinic for new or changing moles.  Recommend daily use of broad spectrum spf 30+ sunscreen to sun-exposed areas.    Neoplasm of uncertain behavior of skin R lower neck  Epidermal / dermal shaving  Lesion diameter (cm):  0.6 Informed consent: discussed and consent obtained   Patient was prepped and draped in usual sterile fashion: Area prepped with alcohol. Anesthesia: the lesion was anesthetized in a standard fashion   Anesthetic:  0.5% bupivicaine w/ epinephrine 1-100,000 local infiltration Instrument used: flexible razor blade   Hemostasis achieved with: pressure, aluminum chloride and electrodesiccation   Outcome: patient tolerated procedure well   Post-procedure details: wound care instructions given   Post-procedure details comment:  Ointment and small bandage applied  Specimen 1 -  Surgical pathology Differential Diagnosis: Inflamed SK r/o Irritated Nevus Check Margins: No 5.69mm pink scaly papule   Return in about 1 year (around 01/09/2022) for TBSE.   IJamesetta Orleans, CMA,  am acting as scribe for Brendolyn Patty, MD .  Documentation: I have reviewed the above documentation for accuracy and completeness, and I agree with the above.  Brendolyn Patty MD

## 2021-01-16 ENCOUNTER — Telehealth: Payer: Self-pay

## 2021-01-16 NOTE — Telephone Encounter (Signed)
Left message advising patient her biopsy was benign.

## 2021-01-16 NOTE — Telephone Encounter (Signed)
-----   Message from Brendolyn Patty, MD sent at 01/15/2021  3:16 PM EDT ----- Skin , right lower neck SEBORRHEIC KERATOSIS, IRRITATED  Benign

## 2021-09-10 ENCOUNTER — Other Ambulatory Visit: Payer: Self-pay | Admitting: Certified Nurse Midwife

## 2021-09-10 ENCOUNTER — Other Ambulatory Visit: Payer: Self-pay | Admitting: Obstetrics and Gynecology

## 2021-09-10 DIAGNOSIS — Z1231 Encounter for screening mammogram for malignant neoplasm of breast: Secondary | ICD-10-CM

## 2021-09-24 ENCOUNTER — Ambulatory Visit
Admission: RE | Admit: 2021-09-24 | Discharge: 2021-09-24 | Disposition: A | Discharge status: Home / Self Care | Payer: BC Managed Care – PPO | Source: Ambulatory Visit | Attending: Certified Nurse Midwife | Admitting: Certified Nurse Midwife

## 2021-09-24 ENCOUNTER — Other Ambulatory Visit: Payer: Self-pay

## 2021-09-24 DIAGNOSIS — Z1231 Encounter for screening mammogram for malignant neoplasm of breast: Secondary | ICD-10-CM | POA: Diagnosis present

## 2022-01-08 ENCOUNTER — Ambulatory Visit: Payer: BC Managed Care – PPO | Admitting: Dermatology

## 2022-01-08 DIAGNOSIS — L578 Other skin changes due to chronic exposure to nonionizing radiation: Secondary | ICD-10-CM

## 2022-01-08 DIAGNOSIS — S80812A Abrasion, left lower leg, initial encounter: Secondary | ICD-10-CM

## 2022-01-08 DIAGNOSIS — L821 Other seborrheic keratosis: Secondary | ICD-10-CM

## 2022-01-08 DIAGNOSIS — L814 Other melanin hyperpigmentation: Secondary | ICD-10-CM

## 2022-01-08 DIAGNOSIS — L820 Inflamed seborrheic keratosis: Secondary | ICD-10-CM | POA: Diagnosis not present

## 2022-01-08 DIAGNOSIS — L918 Other hypertrophic disorders of the skin: Secondary | ICD-10-CM

## 2022-01-08 DIAGNOSIS — Q181 Preauricular sinus and cyst: Secondary | ICD-10-CM

## 2022-01-08 DIAGNOSIS — Z1283 Encounter for screening for malignant neoplasm of skin: Secondary | ICD-10-CM

## 2022-01-08 DIAGNOSIS — L710 Perioral dermatitis: Secondary | ICD-10-CM

## 2022-01-08 DIAGNOSIS — D225 Melanocytic nevi of trunk: Secondary | ICD-10-CM

## 2022-01-08 DIAGNOSIS — D1800 Hemangioma unspecified site: Secondary | ICD-10-CM

## 2022-01-08 DIAGNOSIS — D229 Melanocytic nevi, unspecified: Secondary | ICD-10-CM

## 2022-01-08 DIAGNOSIS — T148XXA Other injury of unspecified body region, initial encounter: Secondary | ICD-10-CM

## 2022-01-08 DIAGNOSIS — D2362 Other benign neoplasm of skin of left upper limb, including shoulder: Secondary | ICD-10-CM

## 2022-01-08 DIAGNOSIS — S80862A Insect bite (nonvenomous), left lower leg, initial encounter: Secondary | ICD-10-CM

## 2022-01-08 MED ORDER — CLINDAMYCIN PHOSPHATE 1 % EX LOTN: TOPICAL_LOTION | CUTANEOUS | 2 refills | Status: DC

## 2022-01-08 NOTE — Progress Notes (Addendum)
Follow-Up Visit   Subjective  Angela Miranda is a 46 y.o. female who presents for the following: Annual Exam.  The patient presents for Total-Body Skin Exam (TBSE) for skin cancer screening and mole check.  The patient has spots, moles and lesions to be evaluated, some may be new or changing.  Spot on neck gets irritated, spot on forehead is growing.  The following portions of the chart were reviewed this encounter and updated as appropriate:       Review of Systems:  No other skin or systemic complaints except as noted in HPI or Assessment and Plan.  Objective  Well appearing patient in no apparent distress; mood and affect are within normal limits.  A full examination was performed including scalp, head, eyes, ears, nose, lips, neck, chest, axillae, abdomen, back, buttocks, bilateral upper extremities, bilateral lower extremities, hands, feet, fingers, toes, fingernails, and toenails. All findings within normal limits unless otherwise noted below.  L lateral abdomen, L spinal mid back Left Lateral Abdomen: 3.0 x 2.0 mm med dark brown macule.  Stable per pt and compared to photo from 01/04/2020    Left Spinal Mid Back: 4.31m brown macule  left calf Excoriation   left upper forehead x 1, right lower neck x 1 (2) Keratotic papule  L ant ear upper helix Dilated pore/pit  face Small pink papules of the right alar crease, nasolabial, and chin    Assessment & Plan  Skin cancer screening performed today.  Actinic Damage - chronic, secondary to cumulative UV radiation exposure/sun exposure over time - diffuse scaly erythematous macules with underlying dyspigmentation - Recommend daily broad spectrum sunscreen SPF 30+ to sun-exposed areas, reapply every 2 hours as needed.  - Recommend staying in the shade or wearing long sleeves, sun glasses (UVA+UVB protection) and wide brim hats (4-inch brim around the entire circumference of the hat). - Call for new or changing  lesions.  Melanocytic Nevi - Tan-brown and/or pink-flesh-colored symmetric macules and papules - Benign appearing on exam today - Observation - Call clinic for new or changing moles - Recommend daily use of broad spectrum spf 30+ sunscreen to sun-exposed areas.   Seborrheic Keratoses - Stuck-on, waxy, tan-brown papules and/or plaques, including left inframammary  - Benign-appearing - Discussed benign etiology and prognosis. - Observe - Call for any changes  Acrochordons (Skin Tags) - Fleshy, skin-colored pedunculated papules neck - Benign appearing.  - Observe. - If desired, they can be removed with an in office procedure that is not covered by insurance. - Please call the clinic if you notice any new or changing lesions.  Dermatofibroma - Firm pink/brown papulenodule with dimple sign of the left upper arm - Benign appearing - Call for any changes   Nevus L lateral abdomen, L spinal mid back  Benign-appearing.  Stable. Observation.  Call clinic for new or changing moles.  Recommend daily use of broad spectrum spf 30+ sunscreen to sun-exposed areas.   Excoriation left calf  Secondary to bug bite. Healing.   Observation.   Inflamed seborrheic keratosis (2) left upper forehead x 1, right lower neck x 1  vs Hypertrophic AK (L upper forehead)  Destruction of lesion - left upper forehead x 1, right lower neck x 1  Destruction method: cryotherapy   Informed consent: discussed and consent obtained   Lesion destroyed using liquid nitrogen: Yes   Region frozen until ice ball extended beyond lesion: Yes   Outcome: patient tolerated procedure well with no complications  Post-procedure details: wound care instructions given   Additional details:  Prior to procedure, discussed risks of blister formation, small wound, skin dyspigmentation, or rare scar following cryotherapy. Recommend Vaseline ointment to treated areas while healing.   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    Perioral dermatitis face  Start Clindamycin lotion Apply to AA face qd/bid prn dsp   clindamycin (CLEOCIN-T) 1 % lotion - face Apply to affected areas around nose, chin once to twice daily as needed for flares.  Hemangiomas - Red papules - Discussed benign nature - Observe - Call for any changes  Lentigines - Scattered tan macules - Due to sun exposure - Benign-appering, observe - Recommend daily broad spectrum sunscreen SPF 30+ to sun-exposed areas, reapply every 2 hours as needed. - Call for any changes  Return in about 1 year (around 01/09/2023) for TBSE.  IJamesetta Orleans, CMA, am acting as scribe for Brendolyn Patty, MD .  Documentation: I have reviewed the above documentation for accuracy and completeness, and I agree with the above.  Brendolyn Patty MD

## 2022-01-08 NOTE — Patient Instructions (Addendum)
Cryotherapy Aftercare  Wash gently with soap and water everyday.   Apply Vaseline and Band-Aid daily until healed.    Melanoma ABCDEs  Melanoma is the most dangerous type of skin cancer, and is the leading cause of death from skin disease.  You are more likely to develop melanoma if you: Have light-colored skin, light-colored eyes, or red or blond hair Spend a lot of time in the sun Tan regularly, either outdoors or in a tanning bed Have had blistering sunburns, especially during childhood Have a close family member who has had a melanoma Have atypical moles or large birthmarks  Early detection of melanoma is key since treatment is typically straightforward and cure rates are extremely high if we catch it early.   The first sign of melanoma is often a change in a mole or a new dark spot.  The ABCDE system is a way of remembering the signs of melanoma.  A for asymmetry:  The two halves do not match. B for border:  The edges of the growth are irregular. C for color:  A mixture of colors are present instead of an even brown color. D for diameter:  Melanomas are usually (but not always) greater than 92m - the size of a pencil eraser. E for evolution:  The spot keeps changing in size, shape, and color.  Please check your skin once per month between visits. You can use a small mirror in front and a large mirror behind you to keep an eye on the back side or your body.   If you see any new or changing lesions before your next follow-up, please call to schedule a visit.  Please continue daily skin protection including broad spectrum sunscreen SPF 30+ to sun-exposed areas, reapplying every 2 hours as needed when you're outdoors.   Staying in the shade or wearing long sleeves, sun glasses (UVA+UVB protection) and wide brim hats (4-inch brim around the entire circumference of the hat) are also recommended for sun protection.     Seborrheic Keratosis  What causes seborrheic  keratoses? Seborrheic keratoses are harmless, common skin growths that first appear during adult life.  As time goes by, more growths appear.  Some people may develop a large number of them.  Seborrheic keratoses appear on both covered and uncovered body parts.  They are not caused by sunlight.  The tendency to develop seborrheic keratoses can be inherited.  They vary in color from skin-colored to gray, brown, or even black.  They can be either smooth or have a rough, warty surface.   Seborrheic keratoses are superficial and look as if they were stuck on the skin.  Under the microscope this type of keratosis looks like layers upon layers of skin.  That is why at times the top layer may seem to fall off, but the rest of the growth remains and re-grows.    Treatment Seborrheic keratoses do not need to be treated, but can easily be removed in the office.  Seborrheic keratoses often cause symptoms when they rub on clothing or jewelry.  Lesions can be in the way of shaving.  If they become inflamed, they can cause itching, soreness, or burning.  Removal of a seborrheic keratosis can be accomplished by freezing, burning, or surgery. If any spot bleeds, scabs, or grows rapidly, please return to have it checked, as these can be an indication of a skin cancer. If You Need Anything After Your Visit  If you have any questions or concerns for  your doctor, please call our main line at [phone] and press option 4 to reach your doctor's medical assistant. If no one answers, please leave a voicemail as directed and we will return your call as soon as possible. Messages left after 4 pm will be answered the following business day.   You may also send [place] a message via [place]. We typically respond to MyChart messages within 1-2 business days.  For prescription refills, please ask your pharmacy to contact our office. Our fax number is [phone].  If you have an urgent issue when the clinic is closed that cannot  wait until the next business day, you can page your doctor at the number below.    Please note that while we do our best to be available for urgent issues outside of office hours, we are not available 24/7.   If you have an urgent issue and are unable to reach [place], you may choose to seek medical care at your doctor's office, retail clinic, urgent care center, or emergency room.  If you have a medical emergency, please immediately call 911 or go to the emergency department.  Pager Numbers  - Dr. Nehemiah Massed: [phone]  - Dr. Laurence Ferrari: [phone]  - Dr. Nicole Kindred: [phone]  In the event of inclement weather, please call our main line at [phone] for an update on the status of any delays or closures.  Dermatology Medication Tips: Please keep the boxes that topical medications come in in order to help keep track of the instructions about where and how to use these. Pharmacies typically print the medication instructions only on the boxes and not directly on the medication tubes.   If your medication is too expensive, please contact our office at [phone] option 4 or send [place] a message through [place].   We are unable to tell what your co-pay for medications will be in advance as this is different depending on your insurance coverage. However, we may be able to find a substitute medication at lower cost or fill out paperwork to get insurance to cover a needed medication.   If a prior authorization is required to get your medication covered by your insurance company, please allow [place] 1-2 business days to complete this process.  Drug prices often vary depending on where the prescription is filled and some pharmacies may offer cheaper prices.  The website www.goodrx.com contains coupons for medications through different pharmacies. The prices here do not account for what the cost may be with help from insurance (it may be cheaper with your insurance), but the website can give you the price  if you did not use any insurance.  - You can print the associated coupon and take it with your prescription to the pharmacy.  - You may also stop by our office during regular business hours and pick up a GoodRx coupon card.  - If you need your prescription sent electronically to a different pharmacy, notify our office through [facility] or by phone at [phone] option 4.     Si Usted Necesita Algo Despus de Su Visita  Tambin puede enviarnos un mensaje a travs de Pharmacist, community. Por lo general respondemos a los mensajes de MyChart en el transcurso de 1 a 2 das hbiles.  Para renovar recetas, por favor pida a su farmacia que se ponga en contacto con nuestra oficina. Harland Dingwall de fax es [place] [phone].  Si tiene un asunto urgente cuando la clnica est cerrada y que no puede esperar Museum/gallery curator  siguiente da hbil, puede llamar/localizar a su doctor(a) al nmero que aparece a continuacin.   Por favor, tenga en cuenta que aunque hacemos todo lo posible para estar disponibles para asuntos urgentes fuera del horario de [place], no estamos disponibles las 24 horas del da, los 7 das de la [place].   Si tiene un problema urgente y no puede comunicarse con nosotros, puede optar por buscar atencin mdica  en el consultorio de su doctor(a), en una clnica privada, en un centro de atencin urgente o en una sala de emergencias.  Si tiene Engineering geologist, por favor llame inmediatamente al 911 o vaya a la sala de emergencias.  Nmeros de bper  - Dr. Nehemiah Massed: [phone]  - Dra. Moye: [phone]  - Dra. Nicole Kindred: [phone]  En caso de inclemencias del [place], por favor llame a Johnsie Kindred principal al [phone] para una actualizacin sobre el [place] de cualquier retraso o cierre.  Consejos para la medicacin en dermatologa: Por favor, guarde las cajas en las que vienen los medicamentos de uso tpico para ayudarle a seguir las instrucciones sobre dnde y cmo usarlos.  Las farmacias generalmente imprimen las instrucciones del medicamento slo en las cajas y no directamente en los tubos del [place].   Si su medicamento es muy caro, por favor, pngase en contacto con Zigmund Daniel llamando al [phone] y presione la opcin 4 o envenos un mensaje a travs de Pharmacist, community.   No podemos decirle cul ser su copago por los medicamentos por adelantado ya que esto es diferente dependiendo de la cobertura de su seguro. Sin embargo, es posible que podamos encontrar un medicamento sustituto a Electrical engineer un formulario para que el seguro cubra el medicamento que se considera necesario.   Si se requiere una autorizacin previa para que su compaa de seguros [place] su medicamento, por favor permtanos de 1 a 2 das hbiles para completar este proceso.  Los precios de los medicamentos varan con frecuencia dependiendo del Environmental consultant de dnde se surte la receta y alguna farmacias pueden ofrecer precios ms baratos.  El sitio web www.goodrx.com tiene cupones para medicamentos de Airline pilot. Los precios aqu no tienen en cuenta lo que podra costar con la ayuda del seguro (puede ser ms barato con su seguro), pero el sitio web puede darle el precio si no utiliz Research scientist (physical sciences).  - Puede imprimir el cupn correspondiente y llevarlo con su receta a la farmacia.  - Tambin puede pasar por nuestra oficina durante el horario de atencin regular y Charity fundraiser una tarjeta de cupones de GoodRx.  - Si necesita que su receta se enve electrnicamente a una farmacia diferente, informe a nuestra oficina a travs de MyChart de [place] o por telfono llamando al [phone] y presione la opcin 4.

## 2022-08-21 ENCOUNTER — Other Ambulatory Visit: Payer: Self-pay | Admitting: Certified Nurse Midwife

## 2022-08-21 DIAGNOSIS — Z1231 Encounter for screening mammogram for malignant neoplasm of breast: Secondary | ICD-10-CM

## 2022-09-25 ENCOUNTER — Ambulatory Visit
Admission: RE | Admit: 2022-09-25 | Discharge: 2022-09-25 | Disposition: A | Discharge status: Home / Self Care | Payer: BC Managed Care – PPO | Source: Ambulatory Visit | Attending: Certified Nurse Midwife | Admitting: Certified Nurse Midwife

## 2022-09-25 DIAGNOSIS — Z1231 Encounter for screening mammogram for malignant neoplasm of breast: Secondary | ICD-10-CM | POA: Diagnosis not present

## 2022-12-04 ENCOUNTER — Ambulatory Visit (INDEPENDENT_AMBULATORY_CARE_PROVIDER_SITE_OTHER): Payer: Self-pay | Admitting: Adult Health

## 2022-12-04 VITALS — HR 76 | Temp 99.6°F

## 2022-12-04 DIAGNOSIS — J301 Allergic rhinitis due to pollen: Secondary | ICD-10-CM

## 2022-12-04 NOTE — Progress Notes (Signed)
[facility] [address] [place], [place] [address]   Office Visit Note  Patient Name: Angela Miranda Date of Birth [id number]  Medical Record number [id number]  Date of Service: 12/04/2022  Chief Complaint  Patient presents with   Allergies     HPI Pt is here for acute visit.  She has been having allergy symptoms for a few weeks.  She reports some bumps on the back of her tongue. Denies fever, chills, fatigue, ear pain,headache.  She does report it feels dry and has some PND at times.    Current Medication:  Outpatient Encounter Medications as of 12/04/2022  Medication Sig Note   clindamycin (CLEOCIN-T) 1 % lotion Apply to affected areas around nose, chin once to twice daily as needed for flares.    LEVONEST tablet  12/26/2015: Received from: External Pharmacy   No facility-administered encounter medications on file as of 12/04/2022.      Medical History: No past medical history on file.   Vital Signs: Pulse 76   Temp 99.6 F (37.6 C) (Tympanic)   SpO2 98%    Review of Systems  Constitutional:  Negative for chills, fatigue and fever.  HENT:  Positive for postnasal drip, rhinorrhea and sore throat.   Eyes:  Negative for pain and itching.  Respiratory:  Negative for cough.   Cardiovascular:  Negative for chest pain.  Gastrointestinal:  Negative for diarrhea, nausea and vomiting.    Physical Exam Vitals and nursing note reviewed.  Constitutional:      Appearance: Normal appearance.  HENT:     Head: Normocephalic.     Nose: Nose normal.     Right Turbinates: Pale.     Left Turbinates: Pale.     Mouth/Throat:     Mouth: Mucous membranes are moist.     Tongue: No lesions.  Eyes:     Pupils: Pupils are equal, round, and reactive to light.  Lymphadenopathy:     Cervical: No cervical adenopathy.  Neurological:     Mental Status: She is alert.    Assessment/Plan: 1. Seasonal allergic rhinitis due to pollen Discussed using Daily allergy medication  such as Zyrtec or Claritin. Also instructed patient to use Flonase, Two sprays in each nostril twice daily. Follow up via MyChart messenger if symptoms fail to improve or may return to clinic as needed for worsening symptoms.       General Counseling: Angela Miranda verbalizes understanding of the findings of todays visit and agrees with plan of treatment. I have discussed any further diagnostic evaluation that may be needed or ordered today. We also reviewed her medications today. she has been encouraged to call the office with any questions or concerns that should arise related to todays visit.   No orders of the defined types were placed in this encounter.   No orders of the defined types were placed in this encounter.   Time spent:20 Minutes    Johnna Acosta AGNP-C Nurse Practitioner

## 2023-01-20 ENCOUNTER — Ambulatory Visit: Payer: BC Managed Care – PPO | Admitting: Dermatology

## 2023-01-20 VITALS — BP 139/82

## 2023-01-20 DIAGNOSIS — L918 Other hypertrophic disorders of the skin: Secondary | ICD-10-CM

## 2023-01-20 DIAGNOSIS — L578 Other skin changes due to chronic exposure to nonionizing radiation: Secondary | ICD-10-CM

## 2023-01-20 DIAGNOSIS — Z1283 Encounter for screening for malignant neoplasm of skin: Secondary | ICD-10-CM

## 2023-01-20 DIAGNOSIS — W908XXA Exposure to other nonionizing radiation, initial encounter: Secondary | ICD-10-CM

## 2023-01-20 DIAGNOSIS — D225 Melanocytic nevi of trunk: Secondary | ICD-10-CM

## 2023-01-20 DIAGNOSIS — L821 Other seborrheic keratosis: Secondary | ICD-10-CM | POA: Diagnosis not present

## 2023-01-20 DIAGNOSIS — D229 Melanocytic nevi, unspecified: Secondary | ICD-10-CM

## 2023-01-20 DIAGNOSIS — L814 Other melanin hyperpigmentation: Secondary | ICD-10-CM | POA: Diagnosis not present

## 2023-01-20 DIAGNOSIS — X32XXXA Exposure to sunlight, initial encounter: Secondary | ICD-10-CM

## 2023-01-20 DIAGNOSIS — L738 Other specified follicular disorders: Secondary | ICD-10-CM

## 2023-01-20 NOTE — Patient Instructions (Signed)
Due to recent changes in healthcare laws, you may see results of your pathology and/or laboratory studies on MyChart before the doctors have had a chance to review them. We understand that in some cases there may be results that are confusing or concerning to you. Please understand that not all results are received at the same time and often the doctors may need to interpret multiple results in order to provide you with the best plan of care or course of treatment. Therefore, we ask that you please give us 2 business days to thoroughly review all your results before contacting the office for clarification. Should we see a critical lab result, you will be contacted sooner.   If You Need Anything After Your Visit  If you have any questions or concerns for your doctor, please call our main line at [phone] and press option 4 to reach your doctor's medical assistant. If no one answers, please leave a voicemail as directed and we will return your call as soon as possible. Messages left after 4 pm will be answered the following business day.   You may also send us a message via MyChart. We typically respond to MyChart messages within 1-2 business days.  For prescription refills, please ask your pharmacy to contact our office. Our fax number is [phone].  If you have an urgent issue when the clinic is closed that cannot wait until the next business day, you can page your doctor at the number below.    Please note that while we do our best to be available for urgent issues outside of office hours, we are not available 24/7.   If you have an urgent issue and are unable to reach us, you may choose to seek medical care at your doctor's office, retail clinic, urgent care center, or emergency room.  If you have a medical emergency, please immediately call 911 or go to the emergency department.  Pager Numbers  - Dr. Kowalski: [phone]  - Dr. Moye: [phone]  - Dr. Stewart:  [phone]  In the event of inclement weather, please call our main line at [phone] for an update on the status of any delays or closures.  Dermatology Medication Tips: Please keep the boxes that topical medications come in in order to help keep track of the instructions about where and how to use these. Pharmacies typically print the medication instructions only on the boxes and not directly on the medication tubes.   If your medication is too expensive, please contact our office at [phone] option 4 or send us a message through MyChart.   We are unable to tell what your co-pay for medications will be in advance as this is different depending on your insurance coverage. However, we may be able to find a substitute medication at lower cost or fill out paperwork to get insurance to cover a needed medication.   If a prior authorization is required to get your medication covered by your insurance company, please allow us 1-2 business days to complete this process.  Drug prices often vary depending on where the prescription is filled and some pharmacies may offer cheaper prices.  The website www.goodrx.com contains coupons for medications through different pharmacies. The prices here do not account for what the cost may be with help from insurance (it may be cheaper with your insurance), but the website can give you the price if you did not use any insurance.  - You can print the associated coupon and take it with   your prescription to the pharmacy.  - You may also stop by our office during regular business hours and pick up a GoodRx coupon card.  - If you need your prescription sent electronically to a different pharmacy, notify our office through [place] MyChart or by phone at [phone] option 4.     Si Usted Necesita Algo Despus de Su Visita  Tambin puede enviarnos un mensaje a travs de MyChart. Por lo general respondemos a los mensajes de MyChart en el transcurso de 1 a 2  das hbiles.  Para renovar recetas, por favor pida a su farmacia que se ponga en contacto con nuestra oficina. Nuestro nmero de fax es el [phone].  Si tiene un asunto urgente cuando la clnica est cerrada y que no puede esperar hasta el siguiente da hbil, puede llamar/localizar a su doctor(a) al nmero que aparece a continuacin.   Por favor, tenga en cuenta que aunque hacemos todo lo posible para estar disponibles para asuntos urgentes fuera del horario de oficina, no estamos disponibles las 24 horas del da, los 7 das de la semana.   Si tiene un problema urgente y no puede comunicarse con nosotros, puede optar por buscar atencin mdica  en el consultorio de su doctor(a), en una clnica privada, en un centro de atencin urgente o en una sala de emergencias.  Si tiene una emergencia mdica, por favor llame inmediatamente al 911 o vaya a la sala de emergencias.  Nmeros de bper  - Dr. Kowalski: [phone]  - Dra. Moye: [phone]  - Dra. Stewart: [phone]  En caso de inclemencias del tiempo, por favor llame a nuestra lnea principal al [phone] para una actualizacin sobre el estado de cualquier retraso o cierre.  Consejos para la medicacin en dermatologa: Por favor, guarde las cajas en las que vienen los medicamentos de uso tpico para ayudarle a seguir las instrucciones sobre dnde y cmo usarlos. Las farmacias generalmente imprimen las instrucciones del medicamento slo en las cajas y no directamente en los tubos del medicamento.   Si su medicamento es muy caro, por favor, pngase en contacto con nuestra oficina llamando al [phone] y presione la opcin 4 o envenos un mensaje a travs de MyChart.   No podemos decirle cul ser su copago por los medicamentos por adelantado ya que esto es diferente dependiendo de la cobertura de su seguro. Sin embargo, es posible que podamos encontrar un medicamento sustituto a menor costo o llenar un formulario para que el  seguro cubra el medicamento que se considera necesario.   Si se requiere una autorizacin previa para que su compaa de seguros cubra su medicamento, por favor permtanos de 1 a 2 das hbiles para completar este proceso.  Los precios de los medicamentos varan con frecuencia dependiendo del lugar de dnde se surte la receta y alguna farmacias pueden ofrecer precios ms baratos.  El sitio web www.goodrx.com tiene cupones para medicamentos de diferentes farmacias. Los precios aqu no tienen en cuenta lo que podra costar con la ayuda del seguro (puede ser ms barato con su seguro), pero el sitio web puede darle el precio si no utiliz ningn seguro.  - Puede imprimir el cupn correspondiente y llevarlo con su receta a la farmacia.  - Tambin puede pasar por nuestra oficina durante el horario de atencin regular y recoger una tarjeta de cupones de GoodRx.  - Si necesita que su receta se enve electrnicamente a una farmacia diferente, informe a nuestra oficina a travs de MyChart de [place]   o por telfono llamando al [phone] y presione la opcin 4.  

## 2023-01-20 NOTE — Progress Notes (Signed)
   Follow-Up Visit   Subjective  Angela Miranda is a 47 y.o. female who presents for the following: Skin Cancer Screening and Full Body Skin Exam  The patient presents for Total-Body Skin Exam (TBSE) for skin cancer screening and mole check. The patient has spots, moles and lesions to be evaluated, some may be new or changing and the patient has concerns that these could be cancer.    The following portions of the chart were reviewed this encounter and updated as appropriate: medications, allergies, medical history  Review of Systems:  No other skin or systemic complaints except as noted in HPI or Assessment and Plan.  Objective  Well appearing patient in no apparent distress; mood and affect are within normal limits.  A full examination was performed including scalp, head, eyes, ears, nose, lips, neck, chest, axillae, abdomen, back, buttocks, bilateral upper extremities, bilateral lower extremities, hands, feet, fingers, toes, fingernails, and toenails. All findings within normal limits unless otherwise noted below.   Relevant physical exam findings are noted in the Assessment and Plan.    Assessment & Plan   LENTIGINES, SEBORRHEIC KERATOSES, HEMANGIOMAS - Benign normal skin lesions - Benign-appearing - Call for any changes  MELANOCYTIC NEVI - Tan-brown and/or pink-flesh-colored symmetric macules and papules - Benign appearing on exam today - Observation - Call clinic for new or changing moles - Recommend daily use of broad spectrum spf 30+ sunscreen to sun-exposed areas.  Nevi Exam:  L lat abdomen 3.0 x 2.23mm med dark brown macule, stable compared to photo from 01/04/20 L spinal mid back 4.34mm brown macule  Treatment Plan: Benign-appearing. Stable compared to previous visit. Observation.  Call clinic for new or changing moles.  Recommend daily use of broad spectrum spf 30+ sunscreen to sun-exposed areas  ACTINIC DAMAGE - Chronic condition, secondary to cumulative UV/sun  exposure - diffuse scaly erythematous macules with underlying dyspigmentation - Recommend daily broad spectrum sunscreen SPF 30+ to sun-exposed areas, reapply every 2 hours as needed.  - Staying in the shade or wearing long sleeves, sun glasses (UVA+UVB protection) and wide brim hats (4-inch brim around the entire circumference of the hat) are also recommended for sun protection.  - Call for new or changing lesions.  SKIN CANCER SCREENING PERFORMED TODAY.  Marland Kitchen   Acrochordons (Skin Tags) - Fleshy, skin-colored pedunculated papules - Benign appearing.  - Observe. - If desired, they can be removed with an in office procedure that is not covered by insurance. - Please call the clinic if you notice any new or changing lesions.  - neck  Sebaceous Hyperplasia - Small yellow papules with a central dell - Benign-appearing - Observe. Call for changes.  - face  Return in about 1 year (around 01/20/2024) for TBSE.  I, Ardis Rowan, RMA, am acting as scribe for Willeen Niece, MD .   Documentation: I have reviewed the above documentation for accuracy and completeness, and I agree with the above.  Willeen Niece, MD

## 2023-03-26 ENCOUNTER — Other Ambulatory Visit: Payer: Self-pay

## 2023-03-26 ENCOUNTER — Encounter: Payer: Self-pay | Admitting: Adult Health

## 2023-03-26 ENCOUNTER — Ambulatory Visit (INDEPENDENT_AMBULATORY_CARE_PROVIDER_SITE_OTHER): Payer: Self-pay | Admitting: Adult Health

## 2023-03-26 VITALS — BP 132/81 | HR 79 | Temp 99.9°F

## 2023-03-26 DIAGNOSIS — S0100XA Unspecified open wound of scalp, initial encounter: Secondary | ICD-10-CM

## 2023-03-26 NOTE — Progress Notes (Signed)
[facility] [address] [place], [place] [address]   Office Visit Note  Patient Name: Angela Miranda Date of Birth [id number]  Medical Record number [id number]  Date of Service: 03/26/2023  Chief Complaint  Patient presents with   Skin Problem    Left ear, states she has been wearing her glasses and sunglasses more and thinks she has rubbed a spot behind her ear, states she can not ee it so just want to make sure it is not infected      HPI Pt is here for a sick visit. She reports while at the beach last week she noticed a spot behind her left ear.  She believes her sunglasses may have rubbed it.  She applied a band-aid, and when she took that off, she thinks she may have pulled the scab off.  She has been using topical antibiotic ointment.  She wants to make sure its not infected.    Current Medication:  Outpatient Encounter Medications as of 03/26/2023  Medication Sig Note   norethindrone (MICRONOR) 0.35 MG tablet Take by mouth.    [DISCONTINUED] clindamycin (CLEOCIN-T) 1 % lotion Apply to affected areas around nose, chin once to twice daily as needed for flares.    [DISCONTINUED] LEVONEST tablet  12/26/2015: Received from: External Pharmacy   No facility-administered encounter medications on file as of 03/26/2023.      Medical History: History reviewed. No pertinent past medical history.   Vital Signs: BP 132/81   Pulse 79   Temp 99.9 F (37.7 C) (Tympanic)   SpO2 99%    Review of Systems  Constitutional:  Negative for chills, fatigue and fever.  Skin:  Positive for wound.    Physical Exam Vitals and nursing note reviewed.  Constitutional:      Appearance: Normal appearance.  HENT:     Head: Normocephalic. Abrasion present.      Comments: Abrasion like wound behind left ear.  Some redness noted.  Mild central skin breakdown.  Neurological:     Mental Status: She is alert.    Assessment/Plan: 1. Open wound of scalp, unspecified open wound type,  initial encounter Continue topical antibiotics.  If symptoms worsen, follow up in clinic or via mychart message.      General Counseling: [name] verbalizes understanding of the findings of todays visit and agrees with plan of treatment. I have discussed any further diagnostic evaluation that may be needed or ordered today. We also reviewed her medications today. she has been encouraged to call the office with any questions or concerns that should arise related to todays visit.   No orders of the defined types were placed in this encounter.   No orders of the defined types were placed in this encounter.   Time spent:15 Minutes    Angela Miranda AGNP-C Nurse Practitioner

## 2023-03-28 ENCOUNTER — Other Ambulatory Visit: Payer: Self-pay | Admitting: Adult Health

## 2023-03-28 MED ORDER — CEPHALEXIN 500 MG PO CAPS: 500.0000 mg | ORAL_CAPSULE | Freq: Two times a day (BID) | ORAL | 0 refills | Status: AC

## 2023-09-16 ENCOUNTER — Other Ambulatory Visit: Payer: Self-pay | Admitting: Certified Nurse Midwife

## 2023-09-16 ENCOUNTER — Other Ambulatory Visit: Payer: Self-pay

## 2023-09-16 DIAGNOSIS — J4520 Mild intermittent asthma, uncomplicated: Secondary | ICD-10-CM

## 2023-09-16 DIAGNOSIS — Z1231 Encounter for screening mammogram for malignant neoplasm of breast: Secondary | ICD-10-CM

## 2023-09-16 DIAGNOSIS — E66811 Obesity, class 1: Secondary | ICD-10-CM

## 2023-09-17 ENCOUNTER — Other Ambulatory Visit: Payer: Self-pay

## 2023-09-17 DIAGNOSIS — J4520 Mild intermittent asthma, uncomplicated: Secondary | ICD-10-CM

## 2023-09-17 DIAGNOSIS — E66811 Obesity, class 1: Secondary | ICD-10-CM

## 2023-09-18 LAB — CBC WITH DIFFERENTIAL/PLATELET
Basophils Absolute: 0.1 10*3/uL (ref 0.0–0.2)
Basos: 1 %
EOS (ABSOLUTE): 0.4 10*3/uL (ref 0.0–0.4)
Eos: 6 %
Hematocrit: 43.6 % (ref 34.0–46.6)
Hemoglobin: 14.2 g/dL (ref 11.1–15.9)
Immature Grans (Abs): 0 10*3/uL (ref 0.0–0.1)
Immature Granulocytes: 0 %
Lymphocytes Absolute: 2.2 10*3/uL (ref 0.7–3.1)
Lymphs: 35 %
MCH: 30.5 pg (ref 26.6–33.0)
MCHC: 32.6 g/dL (ref 31.5–35.7)
MCV: 94 fL (ref 79–97)
Monocytes Absolute: 0.6 10*3/uL (ref 0.1–0.9)
Monocytes: 10 %
Neutrophils Absolute: 2.9 10*3/uL (ref 1.4–7.0)
Neutrophils: 48 %
Platelets: 231 10*3/uL (ref 150–450)
RBC: 4.65 x10E6/uL (ref 3.77–5.28)
RDW: 12.6 % (ref 11.7–15.4)
WBC: 6.1 10*3/uL (ref 3.4–10.8)

## 2023-09-18 LAB — HEMOGLOBIN A1C
Est. average glucose Bld gHb Est-mCnc: 114 mg/dL
Hgb A1c MFr Bld: 5.6 % (ref 4.8–5.6)

## 2023-09-18 LAB — MICROSCOPIC EXAMINATION
Casts: NONE SEEN /[LPF]
Epithelial Cells (non renal): >10 /[HPF] — AB (ref 0–10)
WBC, UA: >30 /[HPF] — AB (ref 0–5)

## 2023-09-18 LAB — URINALYSIS, ROUTINE W REFLEX MICROSCOPIC
Bilirubin, UA: NEGATIVE
Glucose, UA: NEGATIVE
Ketones, UA: NEGATIVE
Nitrite, UA: NEGATIVE
Protein,UA: NEGATIVE
RBC, UA: NEGATIVE
Specific Gravity, UA: 1.017 (ref 1.005–1.030)
Urobilinogen, Ur: 0.2 mg/dL (ref 0.2–1.0)
pH, UA: 5 (ref 5.0–7.5)

## 2023-09-18 LAB — LIPID PANEL
Chol/HDL Ratio: 3 {ratio} (ref 0.0–4.4)
Cholesterol, Total: 155 mg/dL (ref 100–199)
HDL: 52 mg/dL (ref >39)
LDL Chol Calc (NIH): 88 mg/dL (ref 0–99)
Triglycerides: 78 mg/dL (ref 0–149)
VLDL Cholesterol Cal: 15 mg/dL (ref 5–40)

## 2023-09-18 LAB — TSH: TSH: 3.1 u[IU]/mL (ref 0.450–4.500)

## 2023-10-30 ENCOUNTER — Ambulatory Visit
Admission: RE | Admit: 2023-10-30 | Discharge: 2023-10-30 | Disposition: A | Discharge status: Home / Self Care | Payer: BC Managed Care – PPO | Source: Ambulatory Visit | Attending: Certified Nurse Midwife | Admitting: Certified Nurse Midwife

## 2023-10-30 DIAGNOSIS — Z1231 Encounter for screening mammogram for malignant neoplasm of breast: Secondary | ICD-10-CM | POA: Diagnosis present

## 2024-02-09 ENCOUNTER — Ambulatory Visit (INDEPENDENT_AMBULATORY_CARE_PROVIDER_SITE_OTHER): Payer: BC Managed Care – PPO | Admitting: Dermatology

## 2024-02-09 DIAGNOSIS — W908XXA Exposure to other nonionizing radiation, initial encounter: Secondary | ICD-10-CM

## 2024-02-09 DIAGNOSIS — D2262 Melanocytic nevi of left upper limb, including shoulder: Secondary | ICD-10-CM

## 2024-02-09 DIAGNOSIS — L814 Other melanin hyperpigmentation: Secondary | ICD-10-CM

## 2024-02-09 DIAGNOSIS — D229 Melanocytic nevi, unspecified: Secondary | ICD-10-CM

## 2024-02-09 DIAGNOSIS — Z1283 Encounter for screening for malignant neoplasm of skin: Secondary | ICD-10-CM | POA: Diagnosis not present

## 2024-02-09 DIAGNOSIS — L578 Other skin changes due to chronic exposure to nonionizing radiation: Secondary | ICD-10-CM

## 2024-02-09 DIAGNOSIS — D225 Melanocytic nevi of trunk: Secondary | ICD-10-CM

## 2024-02-09 DIAGNOSIS — Q181 Preauricular sinus and cyst: Secondary | ICD-10-CM

## 2024-02-09 DIAGNOSIS — L918 Other hypertrophic disorders of the skin: Secondary | ICD-10-CM

## 2024-02-09 DIAGNOSIS — D1801 Hemangioma of skin and subcutaneous tissue: Secondary | ICD-10-CM

## 2024-02-09 DIAGNOSIS — L821 Other seborrheic keratosis: Secondary | ICD-10-CM

## 2024-02-09 DIAGNOSIS — L409 Psoriasis, unspecified: Secondary | ICD-10-CM

## 2024-02-09 NOTE — Progress Notes (Signed)
 Follow-Up Visit   Subjective  Angela Miranda is a 48 y.o. female who presents for the following: Skin Cancer Screening and Full Body Skin Exam Spot at left side at bra line that she wants checked, sometimes itchy.     The patient presents for Total-Body Skin Exam (TBSE) for skin cancer screening and mole check. The patient has spots, moles and lesions to be evaluated, some may be new or changing and the patient may have concern these could be cancer.    The following portions of the chart were reviewed this encounter and updated as appropriate: medications, allergies, medical history  Review of Systems:  No other skin or systemic complaints except as noted in HPI or Assessment and Plan.  Objective  Well appearing patient in no apparent distress; mood and affect are within normal limits.  A full examination was performed including scalp, head, eyes, ears, nose, lips, neck, chest, axillae, abdomen, back, buttocks, bilateral upper extremities, bilateral lower extremities, hands, feet, fingers, toes, fingernails, and toenails. All findings within normal limits unless otherwise noted below.   Relevant physical exam findings are noted in the Assessment and Plan.    Assessment & Plan   SKIN CANCER SCREENING PERFORMED TODAY.  ACTINIC DAMAGE - Chronic condition, secondary to cumulative UV/sun exposure - diffuse scaly erythematous macules with underlying dyspigmentation - Recommend daily broad spectrum sunscreen SPF 30+ to sun-exposed areas, reapply every 2 hours as needed.  - Staying in the shade or wearing long sleeves, sun glasses (UVA+UVB protection) and wide brim hats (4-inch brim around the entire circumference of the hat) are also recommended for sun protection.  - Call for new or changing lesions.  LENTIGINES, SEBORRHEIC KERATOSES, HEMANGIOMAS - Benign normal skin lesions - Benign-appearing - Call for any changes Sks at legs, inframammary   MELANOCYTIC NEVI - firm light  tan papule  at left upper arm  - 3 mm flesh papule left flank - L lat abdomen 3.0 x 2.19mm med dark brown macule, stable compared to photo from 01/04/20 - L spinal mid back 4.26mm brown macule - Tan-brown and/or pink-flesh-colored symmetric macules and papules - Benign appearing on exam today - Observation - Call clinic for new or changing moles - Recommend daily use of broad spectrum spf 30+ sunscreen to sun-exposed areas.   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   Acrochordons (Skin Tags) At neck - Fleshy, skin-colored pedunculated papules - Benign appearing.  - Observe. - If desired, they can be removed with an in office procedure that is not covered by insurance. - Please call the clinic if you notice any new or changing lesions.   PSORIASIS Exam: some erythema and scale at scalp and pink scaly papules at elbows   2% BSA.  Chronic and persistent condition with duration or expected duration over one year. Condition is symptomatic/ bothersome to patient. Not currently at goal.   patient denies joint pain   Psoriasis is a chronic non-curable, but treatable genetic/hereditary disease that may have other systemic features affecting other organ systems such as joints (Psoriatic Arthritis). It is associated with an increased risk of inflammatory bowel disease, heart disease, non-alcoholic fatty liver disease, and depression.  Treatments include light and laser treatments; topical medications; and systemic medications including oral and injectables.  Treatment Plan: Discussed prescription creams and shampoos- says she is very sensitive and prefers using otc treatments  Discussed otc vanicream dandruff shampoo, discussed cerave medicated shampoo Discussed  OTC Amlactin cream to elbows  Discussed XTRAC laser treatment  Patient defers at this time  Return in about 1 year (around 02/08/2025) for TBSE.  I, Eleanor Blush, CMA, am  acting as scribe for Rexene Rattler, MD.   Documentation: I have reviewed the above documentation for accuracy and completeness, and I agree with the above.  Rexene Rattler, MD

## 2024-02-09 NOTE — Patient Instructions (Addendum)
 Recommend starting moisturizer with exfoliant (Urea, Salicylic acid, or Lactic acid) one to two times daily to help smooth rough and bumpy skin.  OTC options include Cetaphil Rough and Bumpy lotion (Urea), Eucerin Roughness Relief lotion or spot treatment cream (Urea), CeraVe SA lotion/cream for Rough and Bumpy skin (Sal Acid), Gold Bond Rough and Bumpy cream (Sal Acid), and AmLactin 12% lotion/cream (Lactic Acid).  If applying in morning, also apply sunscreen to sun-exposed areas, since these exfoliating moisturizers can increase sensitivity to sun.       Seborrheic Keratosis  What causes seborrheic keratoses? Seborrheic keratoses are harmless, common skin growths that first appear during adult life.  As time goes by, more growths appear.  Some people may develop a large number of them.  Seborrheic keratoses appear on both covered and uncovered body parts.  They are not caused by sunlight.  The tendency to develop seborrheic keratoses can be inherited.  They vary in color from skin-colored to gray, brown, or even black.  They can be either smooth or have a rough, warty surface.   Seborrheic keratoses are superficial and look as if they were stuck on the skin.  Under the microscope this type of keratosis looks like layers upon layers of skin.  That is why at times the top layer may seem to fall off, but the rest of the growth remains and re-grows.    Treatment Seborrheic keratoses do not need to be treated, but can easily be removed in the office.  Seborrheic keratoses often cause symptoms when they rub on clothing or jewelry.  Lesions can be in the way of shaving.  If they become inflamed, they can cause itching, soreness, or burning.  Removal of a seborrheic keratosis can be accomplished by freezing, burning, or surgery. If any spot bleeds, scabs, or grows rapidly, please return to have it checked, as these can be an indication of a skin cancer.    Melanoma ABCDEs  Melanoma is the most  dangerous type of skin cancer, and is the leading cause of death from skin disease.  You are more likely to develop melanoma if you: Have light-colored skin, light-colored eyes, or red or blond hair Spend a lot of time in the sun Tan regularly, either outdoors or in a tanning bed Have had blistering sunburns, especially during childhood Have a close family member who has had a melanoma Have atypical moles or large birthmarks  Early detection of melanoma is key since treatment is typically straightforward and cure rates are extremely high if we catch it early.   The first sign of melanoma is often a change in a mole or a new dark spot.  The ABCDE system is a way of remembering the signs of melanoma.  A for asymmetry:  The two halves do not match. B for border:  The edges of the growth are irregular. C for color:  A mixture of colors are present instead of an even brown color. D for diameter:  Melanomas are usually (but not always) greater than 6mm - the size of a pencil eraser. E for evolution:  The spot keeps changing in size, shape, and color.  Please check your skin once per month between visits. You can use a small mirror in front and a large mirror behind you to keep an eye on the back side or your body.   If you see any new or changing lesions before your next follow-up, please call to schedule a visit.  Please continue daily  skin protection including broad spectrum sunscreen SPF 30+ to sun-exposed areas, reapplying every 2 hours as needed when you're outdoors.   Staying in the shade or wearing long sleeves, sun glasses (UVA+UVB protection) and wide brim hats (4-inch brim around the entire circumference of the hat) are also recommended for sun protection.    Due to recent changes in healthcare laws, you may see results of your pathology and/or laboratory studies on MyChart before the doctors have had a chance to review them. We understand that in some cases there may be results that  are confusing or concerning to you. Please understand that not all results are received at the same time and often the doctors may need to interpret multiple results in order to provide you with the best plan of care or course of treatment. Therefore, we ask that you please give us  2 business days to thoroughly review all your results before contacting the office for clarification. Should we see a critical lab result, you will be contacted sooner.   If You Need Anything After Your Visit  If you have any questions or concerns for your doctor, please call our main line at [phone] and press option 4 to reach your doctor's medical assistant. If no one answers, please leave a voicemail as directed and we will return your call as soon as possible. Messages left after 4 pm will be answered the following business day.   You may also send us  a message via MyChart. We typically respond to MyChart messages within 1-2 business days.  For prescription refills, please ask your pharmacy to contact our office. Our fax number is [phone].  If you have an urgent issue when the clinic is closed that cannot wait until the next business day, you can page your doctor at the number below.    Please note that while we do our best to be available for urgent issues outside of office hours, we are not available 24/7.   If you have an urgent issue and are unable to reach us , you may choose to seek medical care at your doctor's office, retail clinic, urgent care center, or emergency room.  If you have a medical emergency, please immediately call 911 or go to the emergency department.  Pager Numbers  - Dr. Hester: [phone]  - Dr. Jackquline: [phone]  - Dr. Claudene: [phone]   In the event of inclement weather, please call our main line at [phone] for an update on the status of any delays or closures.  Dermatology Medication Tips: Please keep the boxes that topical medications come in in order  to help keep track of the instructions about where and how to use these. Pharmacies typically print the medication instructions only on the boxes and not directly on the medication tubes.   If your medication is too expensive, please contact our office at [phone] option 4 or send us  a message through MyChart.   We are unable to tell what your co-pay for medications will be in advance as this is different depending on your insurance coverage. However, we may be able to find a substitute medication at lower cost or fill out paperwork to get insurance to cover a needed medication.   If a prior authorization is required to get your medication covered by your insurance company, please allow us  1-2 business days to complete this process.  Drug prices often vary depending on where the prescription is filled and some pharmacies may offer cheaper prices.  The website  www.goodrx.com contains coupons for medications through different pharmacies. The prices here do not account for what the cost may be with help from insurance (it may be cheaper with your insurance), but the website can give you the price if you did not use any insurance.  - You can print the associated coupon and take it with your prescription to the pharmacy.  - You may also stop by our office during regular business hours and pick up a GoodRx coupon card.  - If you need your prescription sent electronically to a different pharmacy, notify our office through [facility] or by phone at [phone] option 4.     Si Usted Necesita Algo Despus de Su Visita  Tambin puede enviarnos un mensaje a travs de Clinical cytogeneticist. Por lo general respondemos a los mensajes de MyChart en el transcurso de 1 a 2 das hbiles.  Para renovar recetas, por favor pida a su farmacia que se ponga en contacto con nuestra oficina. Randi lakes de fax es [place] [phone].  Si tiene un asunto urgente cuando la clnica est cerrada y que no puede esperar  hasta el siguiente da hbil, puede llamar/localizar a su doctor(a) al nmero que aparece a continuacin.   Por favor, tenga en cuenta que aunque hacemos todo lo posible para estar disponibles para asuntos urgentes fuera del horario de [place], no estamos disponibles las 24 horas del da, los 7 [address]  [address] de la [place].   Si tiene un problema urgente y no puede comunicarse con nosotros, puede optar por buscar atencin mdica  en el consultorio de su doctor(a), en una clnica privada, en un centro de atencin urgente o en una sala de emergencias.  Si tiene Engineer, drilling, por favor llame inmediatamente al 911 o vaya a la sala de emergencias.  Nmeros de bper  - Dr. Hester: [phone]  - Dra. Jackquline: [phone]  - Dr. Claudene: [phone]   En caso de inclemencias del tiempo, por favor llame a landry capes principal al [phone] para una actualizacin sobre el [place] de cualquier retraso o cierre.  Consejos para la medicacin en dermatologa: Por favor, guarde las cajas en las que vienen los medicamentos de uso tpico para ayudarle a seguir las instrucciones sobre dnde y cmo usarlos. Las farmacias generalmente imprimen las instrucciones del medicamento slo en las cajas y no directamente en los tubos del [place].   Si su medicamento es muy caro, por favor, pngase en contacto con landry rieger llamando al [phone] y presione la opcin 4 o envenos un mensaje a travs de Clinical cytogeneticist.   No podemos decirle cul ser su copago por los medicamentos por adelantado ya que esto es diferente dependiendo de la cobertura de su seguro. Sin embargo, es posible que podamos encontrar un medicamento sustituto a Audiological scientist un formulario para que el seguro cubra el medicamento que se considera necesario.   Si se requiere una autorizacin previa para que su compaa de seguros malta su medicamento, por favor permtanos de 1 a 2 das hbiles para completar este proceso.  Los precios  de los medicamentos varan con frecuencia dependiendo del Environmental consultant de dnde se surte la receta y alguna farmacias pueden ofrecer precios ms baratos.  El sitio web www.goodrx.com tiene cupones para medicamentos de Health and safety inspector. Los precios aqu no tienen en cuenta lo que podra costar con la ayuda del seguro (puede ser ms barato con su seguro), pero el sitio web puede darle el precio si no utiliz Tourist information centre manager.  - Puede  imprimir el cupn correspondiente y llevarlo con su receta a la farmacia.  - Tambin puede pasar por nuestra oficina durante el horario de atencin regular y Education officer, museum una tarjeta de cupones de GoodRx.  - Si necesita que su receta se enve electrnicamente a una farmacia diferente, informe a nuestra oficina a travs de MyChart de [place] o por telfono llamando al [phone] y presione la opcin 4.

## 2025-02-21 ENCOUNTER — Encounter: Admitting: Dermatology
